# Patient Record
Sex: Male | Born: 1965 | Hispanic: Yes | Marital: Married | Smoking: Never smoker
Health system: Southern US, Community
[De-identification: ages and names within clinical notes are randomized; demographics above are authoritative.]

## PROBLEM LIST (undated history)

## (undated) DIAGNOSIS — I10 Essential (primary) hypertension: Secondary | ICD-10-CM

## (undated) DIAGNOSIS — E785 Hyperlipidemia, unspecified: Secondary | ICD-10-CM

## (undated) DIAGNOSIS — M1710 Unilateral primary osteoarthritis, unspecified knee: Secondary | ICD-10-CM

## (undated) DIAGNOSIS — S83207A Unspecified tear of unspecified meniscus, current injury, left knee, initial encounter: Secondary | ICD-10-CM

## (undated) DIAGNOSIS — S83242A Other tear of medial meniscus, current injury, left knee, initial encounter: Secondary | ICD-10-CM

## (undated) DIAGNOSIS — Z9189 Other specified personal risk factors, not elsewhere classified: Secondary | ICD-10-CM

## (undated) DIAGNOSIS — M179 Osteoarthritis of knee, unspecified: Secondary | ICD-10-CM

## (undated) HISTORY — DX: Other tear of medial meniscus, current injury, left knee, initial encounter: S83.242A

---

## 2010-07-07 ENCOUNTER — Ambulatory Visit: Payer: Self-pay | Admitting: Family Medicine

## 2010-10-19 ENCOUNTER — Ambulatory Visit: Payer: Self-pay | Admitting: Family Medicine

## 2014-02-20 ENCOUNTER — Emergency Department (HOSPITAL_COMMUNITY): Payer: Worker's Compensation

## 2014-02-20 ENCOUNTER — Encounter (HOSPITAL_COMMUNITY): Payer: Self-pay | Admitting: Emergency Medicine

## 2014-02-20 ENCOUNTER — Emergency Department (HOSPITAL_COMMUNITY)
Admission: EM | Admit: 2014-02-20 | Discharge: 2014-02-20 | Disposition: A | Discharge status: Home / Self Care | Payer: Worker's Compensation | Attending: Emergency Medicine | Admitting: Emergency Medicine

## 2014-02-20 DIAGNOSIS — I10 Essential (primary) hypertension: Secondary | ICD-10-CM | POA: Diagnosis not present

## 2014-02-20 DIAGNOSIS — S8990XA Unspecified injury of unspecified lower leg, initial encounter: Secondary | ICD-10-CM | POA: Diagnosis present

## 2014-02-20 DIAGNOSIS — R609 Edema, unspecified: Secondary | ICD-10-CM | POA: Diagnosis not present

## 2014-02-20 DIAGNOSIS — M79609 Pain in unspecified limb: Secondary | ICD-10-CM | POA: Diagnosis not present

## 2014-02-20 DIAGNOSIS — Y9289 Other specified places as the place of occurrence of the external cause: Secondary | ICD-10-CM | POA: Diagnosis not present

## 2014-02-20 DIAGNOSIS — Z79899 Other long term (current) drug therapy: Secondary | ICD-10-CM | POA: Insufficient documentation

## 2014-02-20 DIAGNOSIS — Y9389 Activity, other specified: Secondary | ICD-10-CM | POA: Insufficient documentation

## 2014-02-20 DIAGNOSIS — X500XXA Overexertion from strenuous movement or load, initial encounter: Secondary | ICD-10-CM | POA: Insufficient documentation

## 2014-02-20 DIAGNOSIS — E7800 Pure hypercholesterolemia, unspecified: Secondary | ICD-10-CM | POA: Insufficient documentation

## 2014-02-20 DIAGNOSIS — M79662 Pain in left lower leg: Secondary | ICD-10-CM

## 2014-02-20 HISTORY — DX: Essential (primary) hypertension: I10

## 2014-02-20 MED ORDER — NAPROXEN 500 MG PO TABS: 500.0000 mg | ORAL_TABLET | Freq: Two times a day (BID) | ORAL | Status: DC
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
## 2014-02-20 NOTE — ED Notes (Signed)
Ortho notified
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
## 2014-02-20 NOTE — Discharge Instructions (Signed)
It is possible that your symptoms may be secondary to a tendon rupture in your left leg/calf. Recommend you maintain your splint, elevate your leg, rest your leg, and ice it for symptom improvement. Followup with orthopedics as soon as possible for further evaluation of your injury. Until that time, do not bear weight in your left leg. Use crutches when walking. You may take naproxen for pain control. Return if symptoms worsen.  Tendon Injury Tendons are strong, cordlike structures that connect muscle to bone. Tendons are made up of woven fibers, like a rope. A tendon injury is a tear (rupture) of the tendon. The rupture may be partial (only a few of the fibers in your tendon rupture) or complete (your entire tendon ruptures). CAUSES  Tendon injuries can be caused by high-stress activities, such as sports. They also can be caused by a repetitive injury or by a single injury from an excessive, rapid force. SYMPTOMS  Symptoms of tendon injury include pain when you move the joint close to the tendon. Other symptoms are swelling, redness, and warmth. DIAGNOSIS  Tendon injuries often can be diagnosed by physical exam. However, sometimes an X-ray exam or advanced imaging, such as magnetic resonance imaging (MRI), is necessary to determine the extent of the injury. TREATMENT  Partial tendon ruptures often can be treated with immobilization. A splint, bandage, or removable brace usually is used to immobilize the injured tendon. Most injured tendons need to be immobilized for 1 2 months before they are completely healed. Complete tendon ruptures may require surgical reattachment. Document Released: 11/24/2004 Document Revised: 10/06/2011 Document Reviewed: 01/08/2012 [facility] Patient Information 2014 [employer], [place].

## 2014-02-21 NOTE — ED Provider Notes (Signed)
Medical screening examination/treatment/procedure(s) were performed by non-physician practitioner and as supervising physician I was immediately available for consultation/collaboration.   EKG Interpretation None        Dagmar HaitWilliam Darnisha Vernet, MD 02/21/14 [phone]

## 2015-01-16 ENCOUNTER — Other Ambulatory Visit: Payer: Self-pay | Admitting: Orthopedic Surgery

## 2015-01-28 ENCOUNTER — Encounter (HOSPITAL_BASED_OUTPATIENT_CLINIC_OR_DEPARTMENT_OTHER): Payer: Self-pay | Admitting: *Deleted

## 2015-01-28 NOTE — Progress Notes (Signed)
   01/28/15 1608  OBSTRUCTIVE SLEEP APNEA  Have you ever been diagnosed with sleep apnea through a sleep study? No  Do you snore loudly (loud enough to be heard through closed doors)?  0  Do you often feel tired, fatigued, or sleepy during the daytime? 0  Has anyone observed you stop breathing during your sleep? 0  Do you have, or are you being treated for high blood pressure? 1  BMI more than 35 kg/m2? 1  Age over 47 years old? 0  Neck circumference greater than 40 cm/16 inches? 1  Gender: 1  Obstructive Sleep Apnea Score 4   

## 2015-01-28 NOTE — Progress Notes (Signed)
NPO AFTER MN WITH EXCEPTION CLEAR LIQUIDS UNTIL 0900 (NO CREAM/ MILK PRODUCTS).  ARRIVE AT 1300. NEEDS ISTAT AND EKG. WILL TAKE TEKTURNA AND BYSTOLIC AM DOS W/ SIPS OF WATER.

## 2015-02-03 ENCOUNTER — Ambulatory Visit (HOSPITAL_BASED_OUTPATIENT_CLINIC_OR_DEPARTMENT_OTHER): Payer: 59 | Admitting: Anesthesiology

## 2015-02-03 ENCOUNTER — Other Ambulatory Visit: Payer: Self-pay

## 2015-02-03 ENCOUNTER — Encounter (HOSPITAL_BASED_OUTPATIENT_CLINIC_OR_DEPARTMENT_OTHER)
Admission: RE | Disposition: A | Discharge status: Home / Self Care | Payer: Self-pay | Source: Ambulatory Visit | Attending: Specialist

## 2015-02-03 ENCOUNTER — Ambulatory Visit (HOSPITAL_BASED_OUTPATIENT_CLINIC_OR_DEPARTMENT_OTHER)
Admission: RE | Admit: 2015-02-03 | Discharge: 2015-02-03 | Disposition: A | Discharge status: Home / Self Care | Payer: 59 | Source: Ambulatory Visit | Attending: Specialist | Admitting: Specialist

## 2015-02-03 ENCOUNTER — Encounter (HOSPITAL_BASED_OUTPATIENT_CLINIC_OR_DEPARTMENT_OTHER): Payer: Self-pay | Admitting: *Deleted

## 2015-02-03 DIAGNOSIS — Z79899 Other long term (current) drug therapy: Secondary | ICD-10-CM | POA: Insufficient documentation

## 2015-02-03 DIAGNOSIS — Z6841 Body Mass Index (BMI) 40.0 and over, adult: Secondary | ICD-10-CM | POA: Diagnosis not present

## 2015-02-03 DIAGNOSIS — M6752 Plica syndrome, left knee: Secondary | ICD-10-CM | POA: Diagnosis not present

## 2015-02-03 DIAGNOSIS — M2242 Chondromalacia patellae, left knee: Secondary | ICD-10-CM | POA: Diagnosis not present

## 2015-02-03 DIAGNOSIS — Y999 Unspecified external cause status: Secondary | ICD-10-CM | POA: Insufficient documentation

## 2015-02-03 DIAGNOSIS — I10 Essential (primary) hypertension: Secondary | ICD-10-CM | POA: Insufficient documentation

## 2015-02-03 DIAGNOSIS — M65862 Other synovitis and tenosynovitis, left lower leg: Secondary | ICD-10-CM | POA: Insufficient documentation

## 2015-02-03 DIAGNOSIS — M25562 Pain in left knee: Secondary | ICD-10-CM | POA: Diagnosis present

## 2015-02-03 DIAGNOSIS — X58XXXA Exposure to other specified factors, initial encounter: Secondary | ICD-10-CM | POA: Diagnosis not present

## 2015-02-03 DIAGNOSIS — Y929 Unspecified place or not applicable: Secondary | ICD-10-CM | POA: Diagnosis not present

## 2015-02-03 DIAGNOSIS — S83242A Other tear of medial meniscus, current injury, left knee, initial encounter: Secondary | ICD-10-CM

## 2015-02-03 DIAGNOSIS — Y939 Activity, unspecified: Secondary | ICD-10-CM | POA: Insufficient documentation

## 2015-02-03 DIAGNOSIS — M179 Osteoarthritis of knee, unspecified: Secondary | ICD-10-CM | POA: Insufficient documentation

## 2015-02-03 HISTORY — PX: KNEE ARTHROSCOPY WITH MEDIAL MENISECTOMY: SHX5651

## 2015-02-03 HISTORY — DX: Other specified personal risk factors, not elsewhere classified: Z91.89

## 2015-02-03 HISTORY — DX: Unspecified tear of unspecified meniscus, current injury, left knee, initial encounter: S83.207A

## 2015-02-03 HISTORY — DX: Osteoarthritis of knee, unspecified: M17.9

## 2015-02-03 HISTORY — DX: Unilateral primary osteoarthritis, unspecified knee: M17.10

## 2015-02-03 HISTORY — DX: Hyperlipidemia, unspecified: E78.5

## 2015-02-03 HISTORY — DX: Other tear of medial meniscus, current injury, left knee, initial encounter: S83.242A

## 2015-02-03 LAB — POCT I-STAT 4, (NA,K, GLUC, HGB,HCT)
GLUCOSE: 112 mg/dL — AB (ref 70–99)
HCT: 44 % (ref 39.0–52.0)
Hemoglobin: 15 g/dL (ref 13.0–17.0)
Potassium: 3.3 mmol/L — ABNORMAL LOW (ref 3.5–5.1)
SODIUM: 142 mmol/L (ref 135–145)

## 2015-02-03 SURGERY — ARTHROSCOPY, KNEE, WITH MEDIAL MENISCECTOMY
Anesthesia: General | Site: Knee | Laterality: Left

## 2015-02-03 MED ORDER — CEFAZOLIN SODIUM 1-5 GM-% IV SOLN
INTRAVENOUS | Status: AC
  Filled 2015-02-03: qty 50

## 2015-02-03 MED ORDER — HYDROCODONE-ACETAMINOPHEN 5-325 MG PO TABS: 1.0000 | ORAL_TABLET | Freq: Four times a day (QID) | ORAL | Status: DC | PRN

## 2015-02-03 MED ORDER — PROMETHAZINE HCL 25 MG/ML IJ SOLN
6.2500 mg | INTRAMUSCULAR | Status: DC | PRN
  Filled 2015-02-03: qty 1

## 2015-02-03 MED ORDER — FENTANYL CITRATE 0.05 MG/ML IJ SOLN
INTRAMUSCULAR | Status: DC | PRN
  Administered 2015-02-03 (×4): 50 ug via INTRAVENOUS

## 2015-02-03 MED ORDER — ONDANSETRON HCL 4 MG/2ML IJ SOLN
INTRAMUSCULAR | Status: DC | PRN
  Administered 2015-02-03: 4 mg via INTRAVENOUS

## 2015-02-03 MED ORDER — DEXAMETHASONE SODIUM PHOSPHATE 4 MG/ML IJ SOLN
INTRAMUSCULAR | Status: DC | PRN
  Administered 2015-02-03: 10 mg via INTRAVENOUS

## 2015-02-03 MED ORDER — BUPIVACAINE HCL 0.25 % IJ SOLN
INTRAMUSCULAR | Status: DC | PRN
  Administered 2015-02-03: 20 mL

## 2015-02-03 MED ORDER — FENTANYL CITRATE 0.05 MG/ML IJ SOLN
INTRAMUSCULAR | Status: AC
  Filled 2015-02-03: qty 6

## 2015-02-03 MED ORDER — CEPHALEXIN 500 MG PO CAPS
500.0000 mg | ORAL_CAPSULE | Freq: Three times a day (TID) | ORAL | Status: DC
Start: 2015-02-03 — End: 2015-10-28

## 2015-02-03 MED ORDER — CEFAZOLIN SODIUM-DEXTROSE 2-3 GM-% IV SOLR
INTRAVENOUS | Status: AC
  Filled 2015-02-03: qty 50

## 2015-02-03 MED ORDER — POVIDONE-IODINE 7.5 % EX SOLN
Freq: Once | CUTANEOUS | Status: DC
  Filled 2015-02-03: qty 118

## 2015-02-03 MED ORDER — PROPOFOL 10 MG/ML IV BOLUS
INTRAVENOUS | Status: DC | PRN
  Administered 2015-02-03: 200 mg via INTRAVENOUS

## 2015-02-03 MED ORDER — MORPHINE SULFATE 4 MG/ML IJ SOLN
INTRAMUSCULAR | Status: AC
  Filled 2015-02-03: qty 1

## 2015-02-03 MED ORDER — SODIUM CHLORIDE 0.9 % IR SOLN
Status: DC | PRN
  Administered 2015-02-03: 6000 mL

## 2015-02-03 MED ORDER — LIDOCAINE HCL (CARDIAC) 20 MG/ML IV SOLN
INTRAVENOUS | Status: DC | PRN
  Administered 2015-02-03: 100 mg via INTRAVENOUS

## 2015-02-03 MED ORDER — MORPHINE SULFATE 4 MG/ML IJ SOLN
INTRAMUSCULAR | Status: DC | PRN
Start: 2015-02-03 — End: 2015-02-03
  Administered 2015-02-03: 4 mg via INTRAMUSCULAR

## 2015-02-03 MED ORDER — ASPIRIN EC 325 MG PO TBEC
325.0000 mg | DELAYED_RELEASE_TABLET | Freq: Two times a day (BID) | ORAL | Status: DC
Start: 2015-02-03 — End: 2015-10-28

## 2015-02-03 MED ORDER — FENTANYL CITRATE 0.05 MG/ML IJ SOLN
25.0000 ug | INTRAMUSCULAR | Status: DC | PRN
  Filled 2015-02-03: qty 1

## 2015-02-03 MED ORDER — MIDAZOLAM HCL 5 MG/5ML IJ SOLN
INTRAMUSCULAR | Status: DC | PRN
  Administered 2015-02-03: 2 mg via INTRAVENOUS

## 2015-02-03 MED ORDER — EPHEDRINE SULFATE 50 MG/ML IJ SOLN
INTRAMUSCULAR | Status: DC | PRN
  Administered 2015-02-03: 10 mg via INTRAVENOUS

## 2015-02-03 MED ORDER — LACTATED RINGERS IV SOLN
INTRAVENOUS | Status: DC
  Administered 2015-02-03: 13:00:00 via INTRAVENOUS
  Filled 2015-02-03: qty 1000

## 2015-02-03 MED ORDER — MIDAZOLAM HCL 2 MG/2ML IJ SOLN
INTRAMUSCULAR | Status: AC
  Filled 2015-02-03: qty 2

## 2015-02-03 MED ORDER — DEXTROSE 5 % IV SOLN
3.0000 g | INTRAVENOUS | Status: AC
  Administered 2015-02-03: 3 g via INTRAVENOUS
  Filled 2015-02-03: qty 3000

## 2015-02-03 SURGICAL SUPPLY — 52 items
BANDAGE ESMARK 6X9 LF (GAUZE/BANDAGES/DRESSINGS) ×1 IMPLANT
BLADE 4.2CUDA (BLADE) IMPLANT
BLADE CUDA 4.2 (BLADE) IMPLANT
BLADE CUDA GRT WHITE 3.5 (BLADE) ×2 IMPLANT
BLADE CUDA SHAVER 3.5 (BLADE) IMPLANT
BNDG CMPR 9X6 STRL LF SNTH (GAUZE/BANDAGES/DRESSINGS) ×1
BNDG ESMARK 6X9 LF (GAUZE/BANDAGES/DRESSINGS) ×2
BNDG GAUZE ELAST 4 BULKY (GAUZE/BANDAGES/DRESSINGS) ×4 IMPLANT
CANISTER SUCT LVC 12 LTR MEDI- (MISCELLANEOUS) ×2 IMPLANT
CANISTER SUCTION 1200CC (MISCELLANEOUS) ×2 IMPLANT
CLOTH BEACON ORANGE TIMEOUT ST (SAFETY) ×2 IMPLANT
CUFF TOURNIQUET SINGLE 34IN LL (TOURNIQUET CUFF) ×2 IMPLANT
DRAPE ARTHROSCOPY W/POUCH 114 (DRAPES) ×2 IMPLANT
DRAPE INCISE 23X17 IOBAN STRL (DRAPES) ×1
DRAPE INCISE IOBAN 23X17 STRL (DRAPES) ×1 IMPLANT
DRAPE INCISE IOBAN 66X45 STRL (DRAPES) ×2 IMPLANT
DURAPREP 26ML APPLICATOR (WOUND CARE) ×2 IMPLANT
ELECT MENISCUS 165MM 90D (ELECTRODE) IMPLANT
ELECT REM PT RETURN 9FT ADLT (ELECTROSURGICAL)
ELECTRODE REM PT RTRN 9FT ADLT (ELECTROSURGICAL) IMPLANT
GAUZE XEROFORM 1X8 LF (GAUZE/BANDAGES/DRESSINGS) ×2 IMPLANT
GLOVE BIO SURGEON STRL SZ7.5 (GLOVE) ×2 IMPLANT
GLOVE BIO SURGEON STRL SZ8 (GLOVE) ×4 IMPLANT
GLOVE INDICATOR 8.0 STRL GRN (GLOVE) ×4 IMPLANT
GOWN PREVENTION PLUS LG XLONG (DISPOSABLE) ×2 IMPLANT
GOWN STRL REIN XL XLG (GOWN DISPOSABLE) ×2 IMPLANT
GOWN STRL REUS W/ TWL XL LVL3 (GOWN DISPOSABLE) ×2 IMPLANT
GOWN STRL REUS W/TWL XL LVL3 (GOWN DISPOSABLE) ×4 IMPLANT
IMMOBILIZER KNEE 22 UNIV (SOFTGOODS) IMPLANT
IMMOBILIZER KNEE 24 THIGH 36 (MISCELLANEOUS) IMPLANT
IMMOBILIZER KNEE 24 UNIV (MISCELLANEOUS)
KNEE WRAP E Z 3 GEL PACK (MISCELLANEOUS) ×2 IMPLANT
MINI VAC (SURGICAL WAND) ×2 IMPLANT
NEEDLE HYPO 22GX1.5 SAFETY (NEEDLE) ×2 IMPLANT
PACK ARTHROSCOPY DSU (CUSTOM PROCEDURE TRAY) ×2 IMPLANT
PACK BASIN DAY SURGERY FS (CUSTOM PROCEDURE TRAY) ×2 IMPLANT
PAD ABD 8X10 STRL (GAUZE/BANDAGES/DRESSINGS) ×4 IMPLANT
PADDING CAST ABS 4INX4YD NS (CAST SUPPLIES) ×1
PADDING CAST ABS COTTON 4X4 ST (CAST SUPPLIES) ×1 IMPLANT
PADDING CAST COTTON 6X4 STRL (CAST SUPPLIES) ×2 IMPLANT
PENCIL BUTTON HOLSTER BLD 10FT (ELECTRODE) IMPLANT
SET ARTHROSCOPY TUBING (MISCELLANEOUS) ×2
SET ARTHROSCOPY TUBING LN (MISCELLANEOUS) ×1 IMPLANT
SPONGE GAUZE 4X4 12PLY (GAUZE/BANDAGES/DRESSINGS) ×2 IMPLANT
SPONGE GAUZE 4X4 12PLY STER LF (GAUZE/BANDAGES/DRESSINGS) ×2 IMPLANT
SUT ETHILON 4 0 PS 2 18 (SUTURE) ×2 IMPLANT
SYR CONTROL 10ML LL (SYRINGE) ×2 IMPLANT
TOWEL OR 17X24 6PK STRL BLUE (TOWEL DISPOSABLE) ×2 IMPLANT
TUBE CONNECTING 12X1/4 (SUCTIONS) ×2 IMPLANT
WAND 30 DEG SABER W/CORD (SURGICAL WAND) IMPLANT
WAND 90 DEG TURBOVAC W/CORD (SURGICAL WAND) IMPLANT
WATER STERILE IRR 500ML POUR (IV SOLUTION) ×2 IMPLANT

## 2015-02-03 NOTE — H&P (Signed)
Terry Holden is an 49 y.o. male.   Chief Complaint: Left pain HPI: Patient presents with joint discomfort that had been persistent for several months now. Despite conservative treatments, his discomfort has not improved. Imaging was obtained. Other conservative and surgical treatments were discussed in detail. Patient wishes to proceed with surgery as consented. Denies SOB, CP, or calf pain. No Fever, chills, or nausea/ vomiting.   Past Medical History  Diagnosis Date  . Hypertension   . Hyperlipidemia   . Acute meniscal tear of left knee   . OA (osteoarthritis) of knee     left  . At risk for sleep apnea     STOP-BANG= 4      SENT TO PCP 01-28-2015    History reviewed. No pertinent past surgical history.  History reviewed. No pertinent family history. Social History:  reports that he has never smoked. He has never used smokeless tobacco. He reports that he drinks about 1.2 oz of alcohol per week. He reports that he does not use illicit drugs.  Allergies:  Allergies  Allergen Reactions  . Bee Venom Swelling  . Amoxicillin Rash    Medications Prior to Admission  Medication Sig Dispense Refill  . Azilsartan-Chlorthalidone 40-25 MG TABS Take 1 tablet by mouth every morning.     . nebivolol (BYSTOLIC) 10 MG tablet Take 10 mg by mouth every morning.     . TEKTURNA 300 MG tablet Take 300 mg by mouth every morning.       Results for orders placed or performed during the hospital encounter of 02/03/15 (from the past 48 hour(s))  I-STAT 4, (NA,K, GLUC, HGB,HCT)     Status: Abnormal   Collection Time: 02/03/15 12:39 PM  Result Value Ref Range   Sodium 142 135 - 145 mmol/L   Potassium 3.3 (L) 3.5 - 5.1 mmol/L   Glucose, Bld 112 (H) 70 - 99 mg/dL   HCT 40.944.0 81.139.0 - 91.452.0 %   Hemoglobin 15.0 13.0 - 17.0 g/dL   No results found.  Review of Systems  Constitutional: Negative.   HENT: Negative.   Eyes: Negative.   Respiratory: Negative.   Cardiovascular: Negative.   Gastrointestinal:  Negative.   Genitourinary: Negative.   Musculoskeletal: Positive for joint pain.  Skin: Negative.   Neurological: Negative.   Endo/Heme/Allergies: Negative.   Psychiatric/Behavioral: Negative.     Blood pressure 183/87, pulse 76, temperature 98.5 F (36.9 C), temperature source Oral, resp. rate 18, height 5\' 11"  (1.803 m), weight 131.543 kg (290 lb), SpO2 100 %. Physical Exam  Constitutional: He is oriented to person, place, and time. He appears well-developed.  HENT:  Head: Normocephalic.  Eyes: EOM are normal.  Neck: Normal range of motion.  Cardiovascular: Normal rate, normal heart sounds and intact distal pulses.   Respiratory: Effort normal.  GI: Bowel sounds are normal.  Genitourinary:  Deferred  Musculoskeletal:  LLE N/V intact. Calf soft and non tender. ROM normal with pain.   Neurological: He is alert and oriented to person, place, and time.  Skin: Skin is warm and dry.  Psychiatric: His behavior is normal.     Assessment/Plan Left knee torn mensicus and OA: Left Knee Scope with PMM and Chondroplasty D/c home today with family monitoring and care Follow instructions F/u in office next week   STILWELL, BRYSON L 02/03/2015, 3:04 PM

## 2015-02-03 NOTE — Anesthesia Preprocedure Evaluation (Signed)
Anesthesia Evaluation  Patient identified by MRN, date of birth, ID band Patient awake    Reviewed: Allergy & Precautions, NPO status , Patient's Chart, lab work & pertinent test results  Airway Mallampati: II  TM Distance: >3 FB Neck ROM: Full    Dental no notable dental hx.    Pulmonary neg pulmonary ROS,  breath sounds clear to auscultation  Pulmonary exam normal       Cardiovascular hypertension, Pt. on medications and Pt. on home beta blockers Rhythm:Regular Rate:Normal     Neuro/Psych negative neurological ROS  negative psych ROS   GI/Hepatic negative GI ROS, Neg liver ROS,   Endo/Other  Morbid obesity  Renal/GU negative Renal ROS  negative genitourinary   Musculoskeletal  (+) Arthritis -,   Abdominal (+) + obese,   Peds negative pediatric ROS (+)  Hematology negative hematology ROS (+)   Anesthesia Other Findings   Reproductive/Obstetrics negative OB ROS                             Anesthesia Physical Anesthesia Plan  ASA: III  Anesthesia Plan: General   Post-op Pain Management:    Induction: Intravenous  Airway Management Planned: LMA  Additional Equipment:   Intra-op Plan:   Post-operative Plan: Extubation in OR  Informed Consent: I have reviewed the patients History and Physical, chart, labs and discussed the procedure including the risks, benefits and alternatives for the proposed anesthesia with the patient or authorized representative who has indicated his/her understanding and acceptance.   Dental advisory given  Plan Discussed with: CRNA  Anesthesia Plan Comments:         Anesthesia Quick Evaluation

## 2015-02-03 NOTE — Discharge Instructions (Signed)

## 2015-02-03 NOTE — Op Note (Signed)
[phone]#[id number]

## 2015-02-03 NOTE — Interval H&P Note (Signed)
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 After consideration of risks, benefits and other options for treatment, the patient has consented to  Procedure(s): LEFT KNEE ARTHROSCOPY WITH PARTIAL MEDIAL MENISECTOMY AND DEBRIDEMENT/POSSIBLE CHONDROPLASTY (Left) as a surgical intervention .  The patient's history has been reviewed, patient examined, no change in status, stable for surgery.  I have reviewed the patient's chart and labs.  Questions were answered to the patient's satisfaction.     Jamai Dolce ANDREW

## 2015-02-03 NOTE — H&P (View-Only) (Signed)
   01/28/15 1608  OBSTRUCTIVE SLEEP APNEA  Have you ever been diagnosed with sleep apnea through a sleep study? No  Do you snore loudly (loud enough to be heard through closed doors)?  0  Do you often feel tired, fatigued, or sleepy during the daytime? 0  Has anyone observed you stop breathing during your sleep? 0  Do you have, or are you being treated for high blood pressure? 1  BMI more than 35 kg/m2? 1  Age over 47 years old? 0  Neck circumference greater than 40 cm/16 inches? 1  Gender: 1  Obstructive Sleep Apnea Score 4

## 2015-02-03 NOTE — Anesthesia Procedure Notes (Signed)
Procedure Name: LMA Insertion Date/Time: 02/03/2015 3:35 PM Performed by: Terry Holden, Terry Holden Pre-anesthesia Checklist: Patient identified, Emergency Drugs available, Suction available and Patient being monitored Patient Re-evaluated:Patient Re-evaluated prior to inductionOxygen Delivery Method: Circle System Utilized Preoxygenation: Pre-oxygenation with 100% oxygen Intubation Type: IV induction Ventilation: Mask ventilation without difficulty LMA: LMA inserted LMA Size: 4.0 Number of attempts: 1 Airway Equipment and Method: Bite block Placement Confirmation: positive ETCO2 Tube secured with: Tape Dental Injury: Teeth and Oropharynx as per pre-operative assessment

## 2015-02-04 ENCOUNTER — Encounter (HOSPITAL_BASED_OUTPATIENT_CLINIC_OR_DEPARTMENT_OTHER): Payer: Self-pay | Admitting: Specialist

## 2015-02-04 NOTE — Transfer of Care (Signed)
Immediate Anesthesia Transfer of Care Note  Patient: Terry Holden  Procedure(s) Performed: Procedure(s): LEFT KNEE ARTHROSCOPY WITH PARTIAL MEDIAL MENISECTOMY AND DEBRIDEMENT/POSSIBLE CHONDROPLASTY (Left)  Patient Location: PACU  Anesthesia Type:General  Level of Consciousness: awake, alert , oriented and patient cooperative  Airway & Oxygen Therapy: Patient Spontanous Breathing and Patient connected to face mask oxygen  Post-op Assessment: Report given to RN and Post -op Vital signs reviewed and stable  Post vital signs: Reviewed and stable  Last Vitals:  Filed Vitals:   02/03/15 1737  BP: 133/75  Pulse: 71  Temp: 36.7 C  Resp: 16    Complications: No apparent anesthesia complications

## 2015-02-04 NOTE — Anesthesia Postprocedure Evaluation (Signed)
  Anesthesia Post-op Note  Patient: Terry Holden  Procedure(s) Performed: Procedure(s) (LRB): LEFT KNEE ARTHROSCOPY WITH PARTIAL MEDIAL MENISECTOMY AND DEBRIDEMENT/POSSIBLE CHONDROPLASTY (Left)  Patient Location: PACU  Anesthesia Type: general  Level of Consciousness: awake and alert   Airway and Oxygen Therapy: Patient Spontanous Breathing  Post-op Pain: mild  Post-op Assessment: Post-op Vital signs reviewed, Patient's Cardiovascular Status Stable, Respiratory Function Stable, Patent Airway and No signs of Nausea or vomiting  Last Vitals:  Filed Vitals:   02/03/15 1737  BP: 133/75  Pulse: 71  Temp: 36.7 C  Resp: 16    Post-op Vital Signs: stable   Complications: No apparent anesthesia complications

## 2015-02-04 NOTE — Op Note (Signed)
NAMLeone Payor:  Terry Holden, Terry Holden                 ACCOUNT NO.:  [id number]  MEDICAL RECORD NO.:  [id number]  LOCATION:                                 FACILITY:  PHYSICIAN:  Erasmo Leventhalobert Andrew [name], M.D.DATE OF BIRTH:  [birth year]  DATE OF PROCEDURE:  02/03/2015 DATE OF DISCHARGE:  02/03/2015                              OPERATIVE REPORT   PREOPERATIVE DIAGNOSES:  Left knee torn medial meniscus, osteoarthritis.  POSTOPERATIVE DIAGNOSES: 1. Left knee small tear posterior horn medial meniscus. 2. Osteoarthritis, grade 3 extensive femoral trochlea, grade 3     extensive patella, grade 3 lateral, and grade 3 extensive medial     compartment.  PROCEDURES: 1. Left knee arthroscopic partial medial meniscectomy. 2. Chondroplasty of patella, femoral trochlea, and medial compartment.  SURGEON:  Erasmo Leventhalobert Andrew [name], M.D.  ASSISTANT:  [name], PA-C.  ANESTHESIA:  General with intraoperative knee block.  ESTIMATED BLOOD LOSS:  Minimal.  DRAINS:  None.  COMPLICATION:  None.  TOURNIQUET TIME:  23 minutes.  DISPOSITION:  To PACU stable.  OPERATIVE DETAILS:  The patient was counseled in the holding area. Correct site was identified and marked appropriately.  IV antibiotics were given in the OR.  Placed in supine position under general anesthesia.  TED hose had been applied to the uninvolved leg.  Left thigh was placed into a thigh holder and prepped with DuraPrep and draped in a sterile fashion.  Time-out was done and confirmed the left side.  Exsanguinated with an Esmarch.  Tourniquet was inflated to 300 mmHg.  Arthroscopic portals were established.  Proximal lateral, inferolateral, inferomedial, diagnostic arthroscopy revealed intact suprapatellar pouch with a plica formation and synovitis medially and it was resected with motorized shaver.  There was grade 3 extensive chondromalacia of patella, chondroplasty for mechanical shaver, back to stable base, femoral trochlea, normal tracking,  grade 3 chondromalacia, chondroplasty, mechanical shaver, back to stable base.  ACL and PCL were intact.  Lateral side was inspected, lateral meniscus was intact, lateral femoral condyle unremarkable.  Mild chondromalacia, lateral tibial plateau did not require chondroplasty.  Medial compartment inspected.  Small radial tear, posterior-medial meniscus, debrided back to a smooth down the mini VAC system and extensive grade 2 chondromalacia medial compartment and large stable chondral flaps with a mechanical shaver performing chondroplasty back to a nice smooth edge. Knee was then sequentially inspected.  There were no other abnormalities noted, irrigated and arthroscopic equipment was removed.  Portals were closed with 4-0 nylon suture.  At the end of the case, 10 mL 0.25% Sensorcaine was placed to the skin and 20 mL of 0.25% Sensorcaine was placed in the knee joint with 4 mg of morphine sulfate.  Sterile dressing was applied.  Tourniquet deflated.  TED hose was applied.  Ice pack was applied.  Awakened and taken from the operating room to the PACU in stable condition.  He will be stabilized in PACU and discharged home.          ______________________________ Erasmo Leventhalobert Andrew [name], M.D.     RAC/MEDQ  D:  02/03/2015  T:  02/04/2015  Job:  [id number]

## 2015-10-28 ENCOUNTER — Emergency Department (HOSPITAL_BASED_OUTPATIENT_CLINIC_OR_DEPARTMENT_OTHER)
Admission: EM | Admit: 2015-10-28 | Discharge: 2015-10-28 | Disposition: A | Discharge status: Home / Self Care | Payer: Commercial Managed Care - HMO | Attending: Emergency Medicine | Admitting: Emergency Medicine

## 2015-10-28 ENCOUNTER — Encounter (HOSPITAL_BASED_OUTPATIENT_CLINIC_OR_DEPARTMENT_OTHER): Payer: Self-pay | Admitting: Emergency Medicine

## 2015-10-28 ENCOUNTER — Other Ambulatory Visit: Payer: Self-pay | Admitting: Chiropractic Medicine

## 2015-10-28 ENCOUNTER — Ambulatory Visit
Admission: RE | Admit: 2015-10-28 | Discharge: 2015-10-28 | Disposition: A | Discharge status: Home / Self Care | Payer: Commercial Managed Care - HMO | Source: Ambulatory Visit | Attending: Chiropractic Medicine | Admitting: Chiropractic Medicine

## 2015-10-28 DIAGNOSIS — M545 Low back pain: Secondary | ICD-10-CM | POA: Diagnosis present

## 2015-10-28 DIAGNOSIS — Z8639 Personal history of other endocrine, nutritional and metabolic disease: Secondary | ICD-10-CM | POA: Insufficient documentation

## 2015-10-28 DIAGNOSIS — R52 Pain, unspecified: Secondary | ICD-10-CM

## 2015-10-28 DIAGNOSIS — M5388 Other specified dorsopathies, sacral and sacrococcygeal region: Secondary | ICD-10-CM | POA: Insufficient documentation

## 2015-10-28 DIAGNOSIS — M533 Sacrococcygeal disorders, not elsewhere classified: Secondary | ICD-10-CM

## 2015-10-28 DIAGNOSIS — Z880 Allergy status to penicillin: Secondary | ICD-10-CM | POA: Insufficient documentation

## 2015-10-28 DIAGNOSIS — I10 Essential (primary) hypertension: Secondary | ICD-10-CM | POA: Insufficient documentation

## 2015-10-28 DIAGNOSIS — Z87828 Personal history of other (healed) physical injury and trauma: Secondary | ICD-10-CM | POA: Diagnosis not present

## 2015-10-28 DIAGNOSIS — M179 Osteoarthritis of knee, unspecified: Secondary | ICD-10-CM | POA: Insufficient documentation

## 2015-10-28 MED ORDER — METHYLPREDNISOLONE SODIUM SUCC 125 MG IJ SOLR
80.0000 mg | Freq: Once | INTRAMUSCULAR | Status: AC
  Administered 2015-10-28: 80 mg via INTRAMUSCULAR
  Filled 2015-10-28: qty 2

## 2015-10-28 MED ORDER — CYCLOBENZAPRINE HCL 10 MG PO TABS: 10.0000 mg | ORAL_TABLET | Freq: Two times a day (BID) | ORAL | Status: DC | PRN

## 2015-10-28 MED ORDER — HYDROMORPHONE HCL 1 MG/ML IJ SOLN
1.0000 mg | Freq: Once | INTRAMUSCULAR | Status: AC
  Administered 2015-10-28: 1 mg via INTRAMUSCULAR
  Filled 2015-10-28: qty 1

## 2015-10-28 MED ORDER — OXYCODONE-ACETAMINOPHEN 5-325 MG PO TABS: 1.0000 | ORAL_TABLET | Freq: Four times a day (QID) | ORAL | Status: DC | PRN

## 2015-10-28 NOTE — ED Notes (Signed)
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

## 2015-10-28 NOTE — ED Notes (Signed)
MD at bedside. 

## 2015-10-28 NOTE — ED Provider Notes (Signed)
CSN: [id number]     Arrival date & time 10/28/15  1359 History   First MD Initiated Contact with Patient 10/28/15 1555     Chief Complaint  Patient presents with  . Back Pain     Patient is a 49 y.o. male presenting with back pain. The history is provided by the patient. No language interpreter was used.  Back Pain  Terry Piqueeter Holden is a 49 y.o. male who presents to the Emergency Department complaining of low back pain. He has a history of sacral pain that started when he was in the [employer]. He has not had any problems with pain in that area until about a week and a half ago. Started after doing some exercises for work. He is not sure if it is related to the exercise or not. He reports pain in the low sacral/coccygeal region. Pain is described as a soreness and tightness that is worse with sitting, twisting, bending. The pain radiates a small amount to the right buttock. He has no pain radiating down his legs. No fevers, abdominal pain, dysuria, incontinence, constipation, pain with defecation. He was seen by his chiropractor today who performed x-rays and recommended referral to the emergency Department or urgent care for pain control. Symptoms are moderate, constant, worsening.  Past Medical History  Diagnosis Date  . Hypertension   . Hyperlipidemia   . Acute meniscal tear of left knee   . OA (osteoarthritis) of knee     left  . At risk for sleep apnea     STOP-BANG= 4      SENT TO PCP 01-28-2015   Past Surgical History  Procedure Laterality Date  . Knee arthroscopy with medial menisectomy Left 02/03/2015    Procedure: LEFT KNEE ARTHROSCOPY WITH PARTIAL MEDIAL MENISECTOMY AND DEBRIDEMENT/POSSIBLE CHONDROPLASTY;  Surgeon: Eugenia Mcalpineobert Collins, MD;  [facility];  Service: Orthopedics;  Laterality: Left;   History reviewed. No pertinent family history. Social History  Substance Use Topics  . Smoking status: Never Smoker   . Smokeless tobacco: Never Used  . Alcohol Use:  1.2 oz/week    2 Glasses of wine per week    Review of Systems  Musculoskeletal: Positive for back pain.  All other systems reviewed and are negative.     Allergies  Bee venom and Amoxicillin  Home Medications   Prior to Admission medications   Medication Sig Start Date End Date Taking? Authorizing Provider  Azilsartan-Chlorthalidone 40-25 MG TABS Take 1 tablet by mouth every morning.    Yes Historical Provider, MD  ibuprofen (ADVIL,MOTRIN) 200 MG tablet Take 200 mg by mouth every 6 (six) hours as needed.   Yes Historical Provider, MD  nebivolol (BYSTOLIC) 10 MG tablet Take 10 mg by mouth every morning.    Yes Historical Provider, MD  TEKTURNA 300 MG tablet Take 300 mg by mouth every morning.  12/31/13  Yes Historical Provider, MD   BP 142/97 mmHg  Pulse 78  Temp(Src) 98 F (36.7 C) (Oral)  Resp 20  Ht 5\' 11"  (1.803 m)  Wt 300 lb (136.079 kg)  BMI 41.86 kg/m2  SpO2 100% Physical Exam  Constitutional: He is oriented to person, place, and time. He appears well-developed and well-nourished.  HENT:  Head: Normocephalic and atraumatic.  Cardiovascular: Normal rate and regular rhythm.   Pulmonary/Chest: Effort normal. No respiratory distress.  Abdominal: Soft. There is no tenderness. There is no rebound and no guarding.  Musculoskeletal: Normal range of motion.  Area of pain is  located in the lower sacral region at the gluteal cleft. There is no appreciable tenderness on examination. No overlying rashes, erythema, edema.  Neurological: He is alert and oriented to person, place, and time.  5 out of 5 strength in bilateral lower extremities. Normal gait.  Skin: Skin is warm.  Psychiatric: He has a normal mood and affect. His behavior is normal.  Nursing note and vitals reviewed.   ED Course  Procedures (including critical care time) Labs Review Labs Reviewed - No data to display  Imaging Review Dg Lumbar Spine Complete  10/28/2015  CLINICAL DATA:  Lower lumbar pain  midline sharp with minimal movement. No injury. EXAM: LUMBAR SPINE - COMPLETE 4+ VIEW COMPARISON:  None. FINDINGS: Vertebral body alignment, heights and disc space heights are normal. There is minimal spondylosis present. Mild facet arthropathy over the lower lumbar spine. No evidence of compression fracture or subluxation. IMPRESSION: No acute findings. Minimal spondylosis. Electronically Signed   By: Elberta Fortis M.D.   On: 10/28/2015 12:47   Dg Sacrum/coccyx  10/28/2015  CLINICAL DATA:  Low back pain without known injury. EXAM: SACRUM AND COCCYX - 2+ VIEW COMPARISON:  None. FINDINGS: There is no evidence of fracture or other focal bone lesions. Sacroiliac joints appear normal. IMPRESSION: Normal sacrum and coccyx. Electronically Signed   By: Lupita Raider, M.D.   On: 10/28/2015 12:50   I have personally reviewed and evaluated these images and lab results as part of my medical decision-making.   EKG Interpretation None      MDM   Final diagnoses:  Sacral back pain    Patient here for hydration of acute low back/sacral pain. He is neurovascularly intact on examination. No systemic symptoms or concerns for acute infection. Discussed with patient having care for musculoskeletal pain, he has orthopedics referral and follow-up. Discussed return precautions.    Tilden Fossa, MD 10/28/15 [phone]

## 2015-10-28 NOTE — Discharge Instructions (Signed)

## 2016-11-01 DIAGNOSIS — M1712 Unilateral primary osteoarthritis, left knee: Secondary | ICD-10-CM | POA: Diagnosis not present

## 2016-11-01 DIAGNOSIS — M94262 Chondromalacia, left knee: Secondary | ICD-10-CM | POA: Diagnosis not present

## 2016-11-01 DIAGNOSIS — S83232D Complex tear of medial meniscus, current injury, left knee, subsequent encounter: Secondary | ICD-10-CM | POA: Diagnosis not present

## 2016-12-07 DIAGNOSIS — M1712 Unilateral primary osteoarthritis, left knee: Secondary | ICD-10-CM | POA: Diagnosis not present

## 2016-12-14 DIAGNOSIS — M1712 Unilateral primary osteoarthritis, left knee: Secondary | ICD-10-CM | POA: Diagnosis not present

## 2016-12-21 DIAGNOSIS — M1712 Unilateral primary osteoarthritis, left knee: Secondary | ICD-10-CM | POA: Diagnosis not present

## 2017-05-08 DIAGNOSIS — L255 Unspecified contact dermatitis due to plants, except food: Secondary | ICD-10-CM | POA: Diagnosis not present

## 2017-09-20 DIAGNOSIS — E782 Mixed hyperlipidemia: Secondary | ICD-10-CM | POA: Diagnosis not present

## 2017-09-20 DIAGNOSIS — I10 Essential (primary) hypertension: Secondary | ICD-10-CM | POA: Diagnosis not present

## 2018-02-12 DIAGNOSIS — R399 Unspecified symptoms and signs involving the genitourinary system: Secondary | ICD-10-CM | POA: Diagnosis not present

## 2018-02-12 DIAGNOSIS — E782 Mixed hyperlipidemia: Secondary | ICD-10-CM | POA: Diagnosis not present

## 2018-02-12 DIAGNOSIS — N401 Enlarged prostate with lower urinary tract symptoms: Secondary | ICD-10-CM | POA: Diagnosis not present

## 2018-02-12 DIAGNOSIS — R3911 Hesitancy of micturition: Secondary | ICD-10-CM | POA: Diagnosis not present

## 2018-03-22 DIAGNOSIS — I10 Essential (primary) hypertension: Secondary | ICD-10-CM | POA: Diagnosis not present

## 2018-03-22 DIAGNOSIS — E782 Mixed hyperlipidemia: Secondary | ICD-10-CM | POA: Diagnosis not present

## 2018-03-22 DIAGNOSIS — Z8249 Family history of ischemic heart disease and other diseases of the circulatory system: Secondary | ICD-10-CM | POA: Diagnosis not present

## 2018-03-24 ENCOUNTER — Other Ambulatory Visit: Payer: Self-pay | Admitting: Family Medicine

## 2018-03-24 DIAGNOSIS — E782 Mixed hyperlipidemia: Secondary | ICD-10-CM

## 2018-04-09 ENCOUNTER — Ambulatory Visit
Admission: RE | Admit: 2018-04-09 | Discharge: 2018-04-09 | Disposition: A | Discharge status: Home / Self Care | Payer: No Typology Code available for payment source | Source: Ambulatory Visit | Attending: Family Medicine | Admitting: Family Medicine

## 2018-04-09 DIAGNOSIS — E782 Mixed hyperlipidemia: Secondary | ICD-10-CM

## 2018-06-29 ENCOUNTER — Telehealth: Payer: Self-pay

## 2018-06-29 NOTE — Telephone Encounter (Signed)
Notes on file. Referral sent to scheduling.

## 2018-11-08 DIAGNOSIS — I10 Essential (primary) hypertension: Secondary | ICD-10-CM | POA: Diagnosis not present

## 2018-11-08 DIAGNOSIS — E782 Mixed hyperlipidemia: Secondary | ICD-10-CM | POA: Diagnosis not present

## 2018-11-08 DIAGNOSIS — Z0000 Encounter for general adult medical examination without abnormal findings: Secondary | ICD-10-CM | POA: Diagnosis not present

## 2019-01-08 IMAGING — CT CT HEART SCORING
3 series · 14 of 20 positions shown, 16 images · non-contrast
Comparison: None.

CLINICAL DATA: Mixed hyperlipidemia.

EXAM:
CT HEART FOR CALCIUM SCORING
TECHNIQUE: CT heart was performed on a 64 channel system using prospective ECG
gating.
A non-contrast exam for calcium scoring was performed.
Note that this exam targets the heart and the chest was not imaged
in its entirety.

[Series 2: calcium scoring 2.00 qr36 bestdiast 70% · axial · 0.40mm/px · z∈[+1494,+1590]mm · 4 of 80 slices shown]
[im 16/80  vessel]
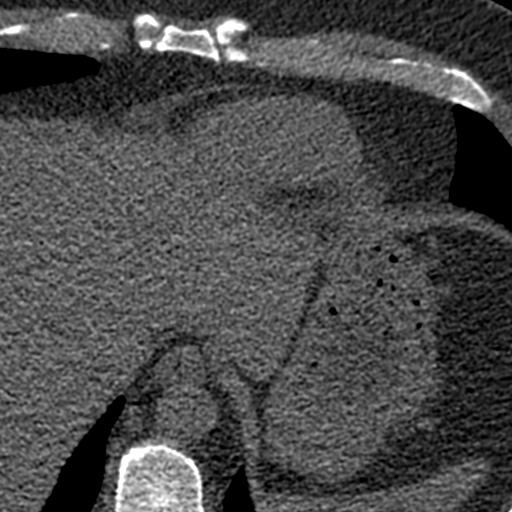
[im 32/80  vessel]
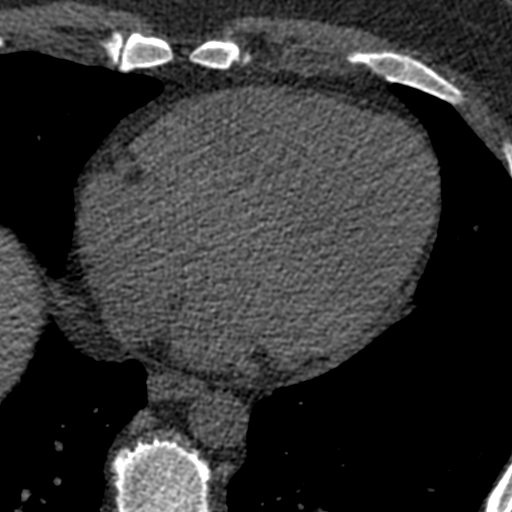
[im 48/80  vessel]
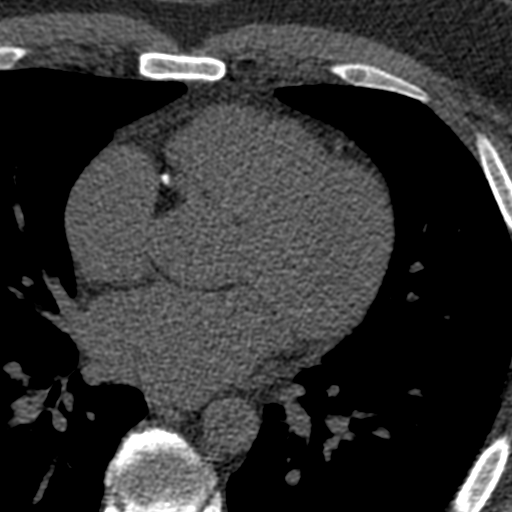
[im 64/80  vessel]
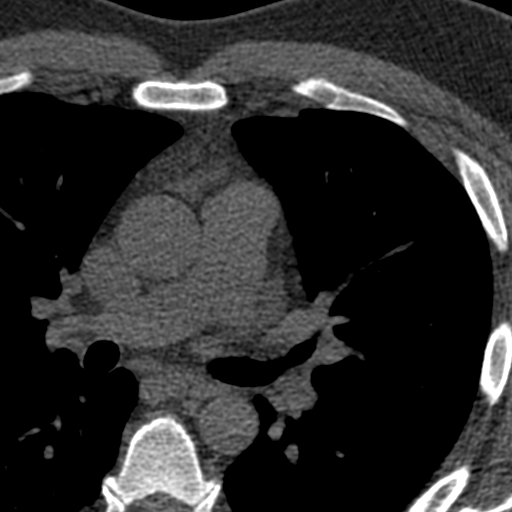

[Series 3: calcium scoring 2.00 br40 bestdiast 70% ax fov · axial · 0.67mm/px · z∈[+1490,+1594]mm · 5 of 80 slices shown, 7 images]
[im 14/80  vessel]
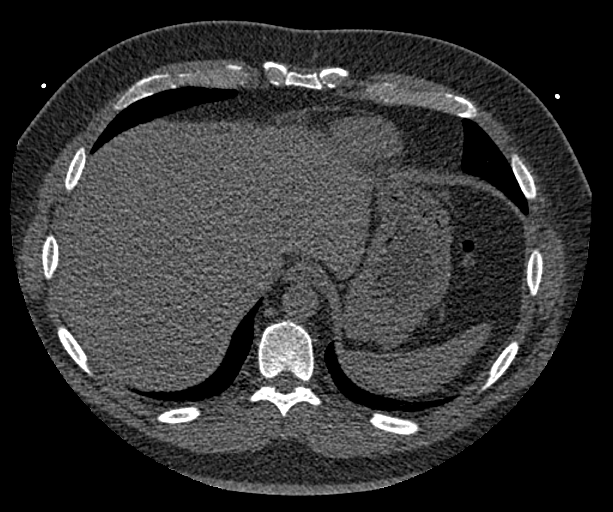
[im 14/80  lung]
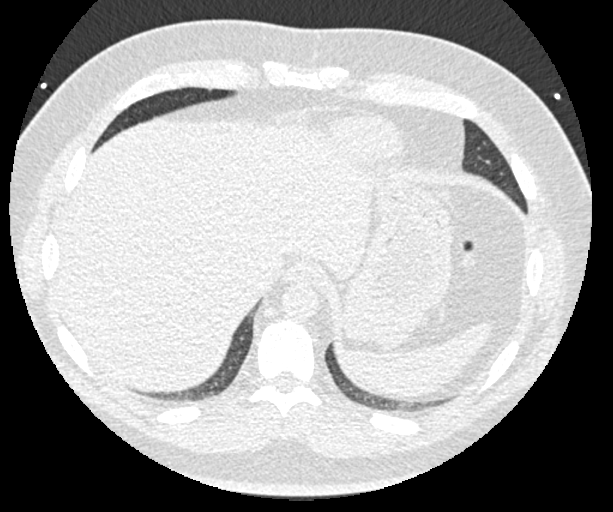
[im 27/80  vessel]
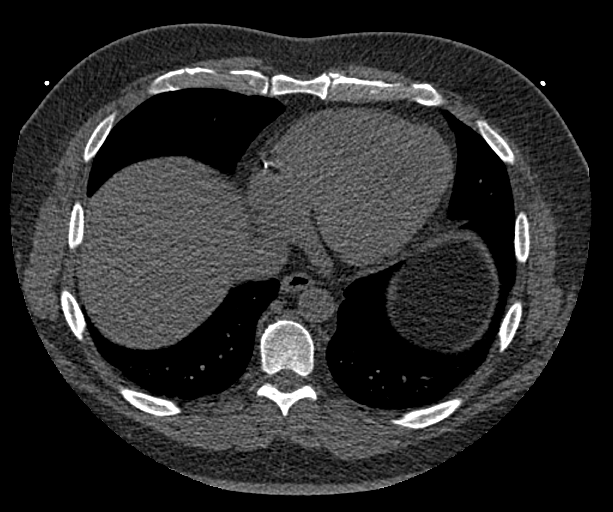
[im 40/80  vessel]
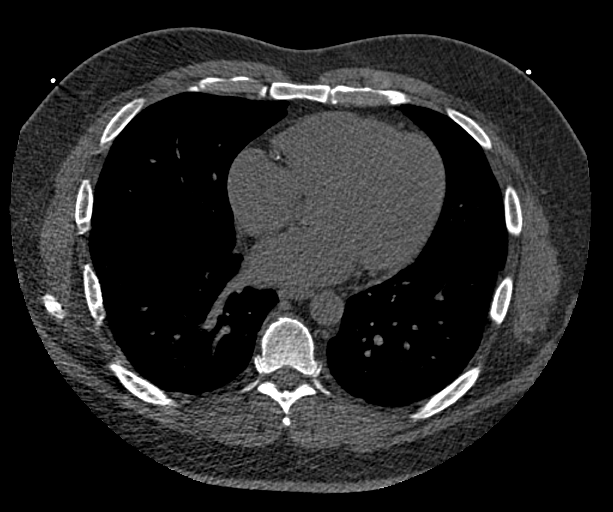
[im 53/80  vessel]
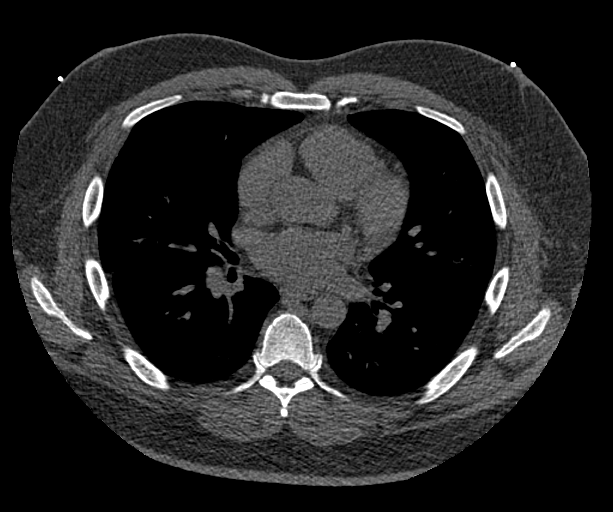
[im 66/80  vessel]
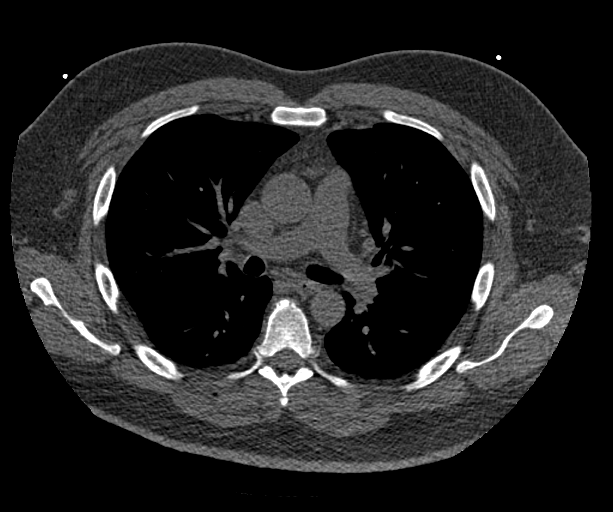
[im 66/80  lung]
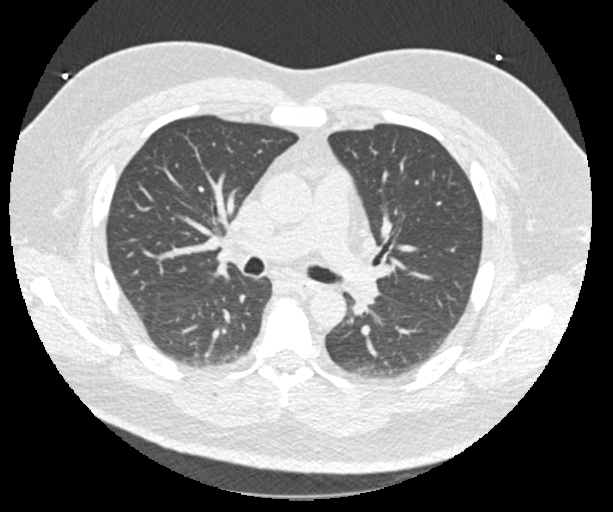

[Series 9: calcium scoring 2.00 br60 bestdiast 70% ax fov · axial · 0.67mm/px · z∈[+1490,+1594]mm · 5 of 80 slices shown]
[im 14/80  vessel]
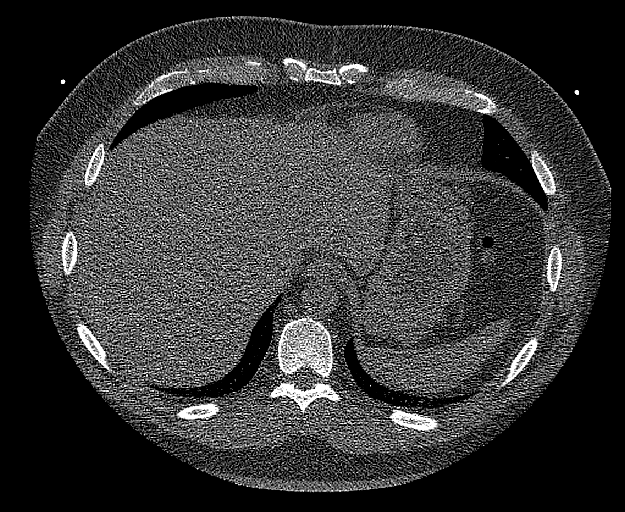
[im 27/80  vessel]
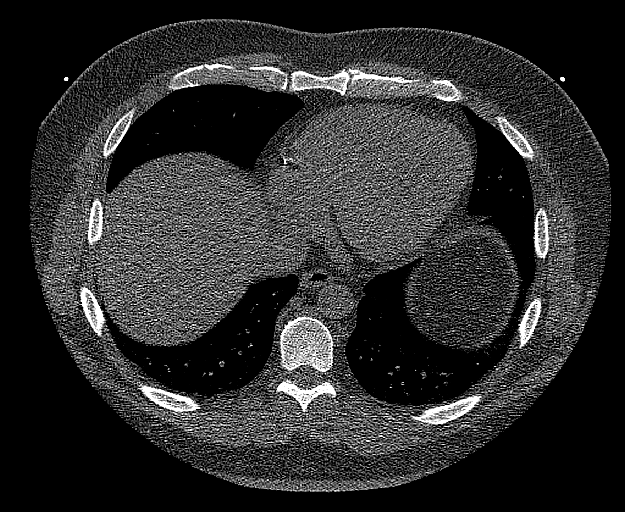
[im 40/80  vessel]
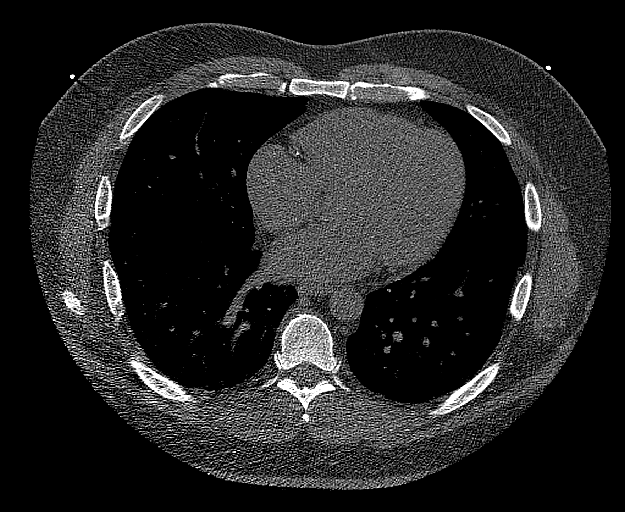
[im 53/80  vessel]
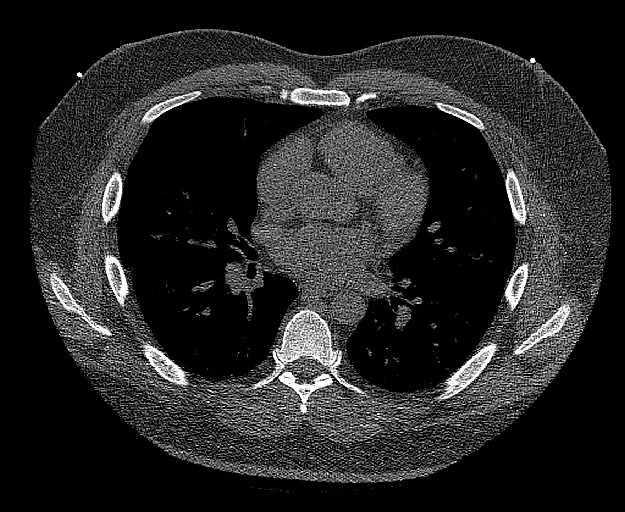
[im 66/80  vessel]
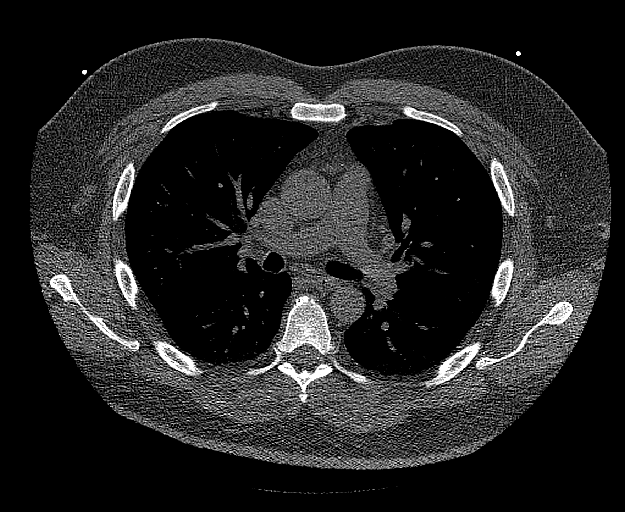

[14 of 20 positions shown; findings below may reference images not displayed]

FINDINGS: Technical quality: Good.

CORONARY CALCIUM

Total Agatston Score: 357 with calcifications scattered throughout
the right coronary artery

[HOSPITAL] percentile:  98

OTHER FINDINGS:

Cardiovascular: Heart is normal size. Visualized aorta is normal
caliber.

Mediastinum/Nodes: No adenopathy in the lower mediastinum or hila.

Lungs/Pleura: Visualized lower lungs clear.  No effusions.

Upper Abdomen: Imaging into the upper abdomen shows no acute
findings.

Musculoskeletal: Chest wall soft tissues are unremarkable. No acute
bony abnormality.
IMPRESSION: The observed calcium score of 357 is at the percentile 98 for
subjects of the same age, gender and race/ethnicity who are free of
clinical cardiovascular disease and treated diabetes.

No acute or significant extracardiac abnormality

## 2019-01-17 DIAGNOSIS — I10 Essential (primary) hypertension: Secondary | ICD-10-CM | POA: Diagnosis not present

## 2019-03-14 ENCOUNTER — Telehealth (INDEPENDENT_AMBULATORY_CARE_PROVIDER_SITE_OTHER): Payer: 59 | Admitting: Cardiology

## 2019-03-14 ENCOUNTER — Other Ambulatory Visit: Payer: Self-pay

## 2019-03-14 ENCOUNTER — Encounter: Payer: Self-pay | Admitting: Cardiology

## 2019-03-14 VITALS — BP 158/98 | HR 80 | Ht 71.0 in | Wt 265.0 lb

## 2019-03-14 DIAGNOSIS — I10 Essential (primary) hypertension: Secondary | ICD-10-CM | POA: Diagnosis not present

## 2019-03-14 DIAGNOSIS — I2584 Coronary atherosclerosis due to calcified coronary lesion: Secondary | ICD-10-CM

## 2019-03-14 DIAGNOSIS — Z79899 Other long term (current) drug therapy: Secondary | ICD-10-CM

## 2019-03-14 DIAGNOSIS — I2510 Atherosclerotic heart disease of native coronary artery without angina pectoris: Secondary | ICD-10-CM

## 2019-03-14 MED ORDER — SPIRONOLACTONE 25 MG PO TABS: 12.5000 mg | ORAL_TABLET | Freq: Every day | ORAL | 3 refills | Status: DC

## 2019-03-14 NOTE — Progress Notes (Signed)
Virtual Visit via Video Note   This visit type was conducted due to national recommendations for restrictions regarding the COVID-19 Pandemic (e.g. social distancing) in an effort to limit this patient's exposure and mitigate transmission in our community.  Due to his co-morbid illnesses, this patient is at least at moderate risk for complications without adequate follow up.  This format is felt to be most appropriate for this patient at this time.  All issues noted in this document were discussed and addressed.  A limited physical exam was performed with this format.  Please refer to the patient's chart for his consent to telehealth for [facility].   Date:  03/14/2019   ID:  Terry Holden, DOB [birth year], MRN [id number]  Patient Location: Home Provider Location: Home  PCP:  Patient, No Pcp Per  Cardiologist:  No primary care provider on file. Skains new Electrophysiologist:  None   Evaluation Performed:  Consultation - Terry Holden was referred by Dr. Tenny Craw for the evaluation of hypertension.  Chief Complaint: Evaluation of hypertension, difficult to control at the request of Dr. Tenny Craw  History of Present Illness:    Terry Holden is a 53 y.o. male with difficulties with hypertension currently on HCTZ 12.5 mg a day, Bystolic 20 mg a day, Azilsartan 40 mg a day, amlodipine 10 mg a day.  Last year 320 pounds, started diet and in 11/19 thought lightheaded was because of 70 pound weight loss. Dr. Tenny Craw took off of one of the meds. Was high again at yearly PE. Felt great. [employer] dept physical, Dr. Alto Denver (190/110), on light duty since then.   158/98 past 2 months.   Father CABG, CVA Mother HTN  Hemoglobin 16.6, LDL 216 with total cholesterol 343, HDL 53, creatinine 0.92 potassium 4.2 sodium 141  Had coronary calcium score performed on 04/09/2018-357 which was 90 percentile.  Aorta was normal in caliber.  No other findings.  The patient does not have symptoms concerning for COVID-19 infection  (fever, chills, cough, or new shortness of breath).    Past Medical History:  Diagnosis Date  . Acute medial meniscus tear of left knee 02/03/2015  . Acute meniscal tear of left knee   . At risk for sleep apnea    STOP-BANG= 4      SENT TO PCP 01-28-2015  . Hyperlipidemia   . Hypertension   . OA (osteoarthritis) of knee    left   Past Surgical History:  Procedure Laterality Date  . KNEE ARTHROSCOPY WITH MEDIAL MENISECTOMY Left 02/03/2015   Procedure: LEFT KNEE ARTHROSCOPY WITH PARTIAL MEDIAL MENISECTOMY AND DEBRIDEMENT/POSSIBLE CHONDROPLASTY;  Surgeon: Eugenia Mcalpine, MD;  Location: [facility];  Service: Orthopedics;  Laterality: Left;     Current Meds  Medication Sig  . amLODipine (NORVASC) 10 MG tablet Take 10 mg by mouth daily.  . Azilsartan-Chlorthalidone 40-25 MG TABS Take 1 tablet by mouth every morning.   . hydrochlorothiazide (HYDRODIURIL) 25 MG tablet Take 25 mg by mouth daily.  . nebivolol (BYSTOLIC) 10 MG tablet Take 10 mg by mouth every morning.   . rosuvastatin (CRESTOR) 10 MG tablet Take 10 mg by mouth daily.     Allergies:   Bee venom and Amoxicillin   Social History   Tobacco Use  . Smoking status: Never Smoker  . Smokeless tobacco: Never Used  Substance Use Topics  . Alcohol use: Yes    Alcohol/week: 2.0 standard drinks    Types: 2 Glasses of wine per week  . Drug  use: No     Family Hx: The patient's family history is not on file.  ROS:   Please see the history of present illness.    Denies any fevers chills nausea vomiting syncope bleeding orthopnea PND. All other systems reviewed and are negative.   Prior CV studies:   The following studies were reviewed today:  Coronary calcium score, EKG  Labs/Other Tests and Data Reviewed:    EKG:  An ECG dated 02/03/15 was personally reviewed today and demonstrated:  Sinus rhythm no other abnormalities  Recent Labs: No results found for requested labs within last 8760 hours.   Recent  Lipid Panel No results found for: CHOL, TRIG, HDL, CHOLHDL, LDLCALC, LDLDIRECT  Wt Readings from Last 3 Encounters:  03/14/19 265 lb (120.2 kg)  10/28/15 300 lb (136.1 kg)  02/03/15 290 lb (131.5 kg)     Objective:    Vital Signs:  BP (!) 158/98 (BP Location: Left Arm, Patient Position: Sitting, Cuff Size: Normal)   Pulse 80   Ht 5\' 11"  (1.803 m)   Wt 265 lb (120.2 kg)   BMI 36.96 kg/m    VITAL SIGNS:  reviewed GEN:  no acute distress EYES:  sclerae anicteric, EOMI - Extraocular Movements Intact RESPIRATORY:  normal respiratory effort, symmetric expansion SKIN:  no rash, lesions or ulcers. MUSCULOSKELETAL:  no obvious deformities. NEURO:  alert and oriented x 3, no obvious focal deficit PSYCH:  normal affect  ASSESSMENT & PLAN:    Difficult to control hypertension -Multi-drug plan noted.  Potassium and sodium normal. -I will add spironolactone 12.5 mg once a day.  Check basic metabolic profile in 1 week.  If blood pressure still elevated, we will increase the spironolactone to 25 mg once a day. -I do realize he is on approximately 50 mg of thiazide type diuretic.  Lets continue that for now. -Please set him up with the pharmacy hypertension clinic to continue to monitor this closely. - If we are unsuccessful at controlling the blood pressure with the addition of spironolactone, we will consider ancillary testing such as renal artery ultrasound, urine and plasma hormone testing. - Expressed the need for continued use of other medications multidrug regimen at this time.  Hopefully his weight loss continues, his blood pressure regimen will drop.  Reduction in blood pressure will help reduce heart attack risk, heart failure risk, renal failure risk  Morbid obesity - Continue to encourage weight loss.  BMI 35 or greater with comorbidities.  Excellent job with 70 pound weight loss.  Hopefully his weight continues to decrease, his blood pressure will follow.  Coronary calcification  - Excellent use of statin.  Continue with optimizing blood pressure control.  Marked hyperlipidemia - LDL above 200.  Continue statin use.  If unsuccessful, could consider additional use of PCSK9 inhibitor for instance.  COVID-19 Education: The signs and symptoms of COVID-19 were discussed with the patient and how to seek care for testing (follow up with PCP or arrange E-visit).  The importance of social distancing was discussed today.  Time:   Today, I have spent 20 minutes with the patient with telehealth technology discussing the above problems.     Medication Adjustments/Labs and Tests Ordered: Current medicines are reviewed at length with the patient today.  Concerns regarding medicines are outlined above.   Tests Ordered: Orders Placed This Encounter  Procedures  . Basic metabolic panel    Medication Changes: Meds ordered this encounter  Medications  . spironolactone (ALDACTONE) 25 MG tablet  Sig: Take 0.5 tablets (12.5 mg total) by mouth daily.    Dispense:  45 tablet    Refill:  3    Disposition:  Follow up in 2 week(s)  Signed, Donato SchultzMark Skains, MD  03/14/2019 9:43 AM    [facility]

## 2019-03-14 NOTE — Patient Instructions (Signed)
Medication Instructions:  Start Spironolactone 25 mg - 1/2 tablet by mouth daily.  Continue all other medications as listed.  If you need a refill on your cardiac medications before your next appointment, please call your pharmacy.   Lab work: Please have blood work drawn as scheduled Thursday 03/21/2019. (BMP)  You may eat and drink as normal this day.  The lab is located at [address], [place], [place].  If you have labs (blood work) drawn today and your tests are completely normal, you will receive your results only by: Marland Kitchen MyChart Message (if you have MyChart) OR . A paper copy in the mail If you have any lab test that is abnormal or we need to change your treatment, we will call you to review the results.  Follow-Up: You will be contacted to be scheduled in 2 weeks for follow up with the Hypertension Clinic (in our office).  Thank you for choosing  [facility]!!

## 2019-03-21 ENCOUNTER — Other Ambulatory Visit: Payer: Self-pay

## 2019-03-21 ENCOUNTER — Other Ambulatory Visit: Payer: 59 | Admitting: *Deleted

## 2019-03-21 DIAGNOSIS — Z79899 Other long term (current) drug therapy: Secondary | ICD-10-CM | POA: Diagnosis not present

## 2019-03-21 DIAGNOSIS — I10 Essential (primary) hypertension: Secondary | ICD-10-CM | POA: Diagnosis not present

## 2019-03-21 LAB — BASIC METABOLIC PANEL
BUN/Creatinine Ratio: 17 (ref 9–20)
BUN: 15 mg/dL (ref 6–24)
CO2: 26 mmol/L (ref 20–29)
Calcium: 9.9 mg/dL (ref 8.7–10.2)
Chloride: 101 mmol/L (ref 96–106)
Creatinine, Ser: 0.87 mg/dL (ref 0.76–1.27)
GFR calc Af Amer: 116 mL/min/{1.73_m2} (ref >59)
GFR calc non Af Amer: 101 mL/min/{1.73_m2} (ref >59)
Glucose: 115 mg/dL — ABNORMAL HIGH (ref 65–99)
Potassium: 4.2 mmol/L (ref 3.5–5.2)
Sodium: 139 mmol/L (ref 134–144)

## 2019-03-28 ENCOUNTER — Telehealth: Payer: 59 | Admitting: Cardiology

## 2019-04-01 ENCOUNTER — Telehealth: Payer: Self-pay

## 2019-04-01 NOTE — Telephone Encounter (Signed)
YOUR CARDIOLOGY TEAM HAS ARRANGED FOR AN E-VISIT FOR YOUR APPOINTMENT - PLEASE REVIEW IMPORTANT INFORMATION BELOW SEVERAL DAYS PRIOR TO YOUR APPOINTMENT  Due to the recent COVID-19 pandemic, we are transitioning in-person office visits to tele-medicine visits in an effort to decrease unnecessary exposure to our patients, their families, and staff. These visits are billed to your insurance just like a normal visit is. We also encourage you to sign up for MyChart if you have not already done so. You will need a smartphone if possible. For patients that do not have this, we can still complete the visit using a regular telephone but do prefer a smartphone to enable video when possible. You may have a family member that lives with you that can help. If possible, we also ask that you have a blood pressure cuff and scale at home to measure your blood pressure, heart rate and weight prior to your scheduled appointment. Patients with clinical needs that need an in-person evaluation and testing will still be able to come to the office if absolutely necessary. If you have any questions, feel free to call our office.     YOUR PROVIDER WILL BE USING THE FOLLOWING PLATFORM TO COMPLETE YOUR VISIT: [email or web address]  . IF USING MYCHART - How to Download the MyChart App to Your SmartPhone   - If Apple, go to App Store and type in MyChart in the search bar and download the app. If Android, ask patient to go to Google Play Store and type in MyChart in the search bar and download the app. The app is free but as with any other app downloads, your phone may require you to verify saved payment information or Apple/Android password.  - You will need to then log into the app with your MyChart username and password, and select [facility] as your healthcare provider to link the account.  - When it is time for your visit, go to the MyChart app, find appointments, and click Begin Video Visit. Be sure to Select Allow for your device to  access the Microphone and Camera for your visit. You will then be connected, and your provider will be with you shortly.  **If you have any issues connecting or need assistance, please contact MyChart service desk [phone] ([phone])**  **If using a computer, in order to ensure the best quality for your visit, you will need to use either of the following Internet Browsers: Google Chrome or Microsoft Edge**  . IF USING DOXIMITY or [email or web address].ME - The staff will give you instructions on receiving your link to join the meeting the day of your visit.      2-3 DAYS BEFORE YOUR APPOINTMENT  You will receive a telephone call from one of our [facility] team members - your caller ID may say "Unknown caller." If this is a video visit, we will walk you through how to get the video launched on your phone. We will remind you check your blood pressure, heart rate and weight prior to your scheduled appointment. If you have an Apple Watch or Kardia, please upload any pertinent ECG strips the day before or morning of your appointment to MyChart. Our staff will also make sure you have reviewed the consent and agree to move forward with your scheduled tele-health visit.     THE DAY OF YOUR APPOINTMENT  Approximately 15 minutes prior to your scheduled appointment, you will receive a telephone call from one of [facility] team - your caller ID may say "Unknown caller."    Our staff will confirm medications, vital signs for the day and any symptoms you may be experiencing. Please have this information available prior to the time of visit start. It may also be helpful for you to have a pad of paper and pen handy for any instructions given during your visit. They will also walk you through joining the smartphone meeting if this is a video visit.    CONSENT FOR TELE-HEALTH VISIT - PLEASE REVIEW  I hereby voluntarily request, consent and authorize [facility] and its employed or contracted physicians, physician  assistants, nurse practitioners or other licensed health care professionals (the Practitioner), to provide me with telemedicine health care services (the "Services") as deemed necessary by the treating Practitioner. I acknowledge and consent to receive the Services by the Practitioner via telemedicine. I understand that the telemedicine visit will involve communicating with the Practitioner through live audiovisual communication technology and the disclosure of certain medical information by electronic transmission. I acknowledge that I have been given the opportunity to request an in-person assessment or other available alternative prior to the telemedicine visit and am voluntarily participating in the telemedicine visit.  I understand that I have the right to withhold or withdraw my consent to the use of telemedicine in the course of my care at any time, without affecting my right to future care or treatment, and that the Practitioner or I may terminate the telemedicine visit at any time. I understand that I have the right to inspect all information obtained and/or recorded in the course of the telemedicine visit and may receive copies of available information for a reasonable fee.  I understand that some of the potential risks of receiving the Services via telemedicine include:  . Delay or interruption in medical evaluation due to technological equipment failure or disruption; . Information transmitted may not be sufficient (e.g. poor resolution of images) to allow for appropriate medical decision making by the Practitioner; and/or  . In rare instances, security protocols could fail, causing a breach of personal health information.  Furthermore, I acknowledge that it is my responsibility to provide information about my medical history, conditions and care that is complete and accurate to the best of my ability. I acknowledge that Practitioner's advice, recommendations, and/or decision may be based on  factors not within their control, such as incomplete or inaccurate data provided by me or distortions of diagnostic images or specimens that may result from electronic transmissions. I understand that the practice of medicine is not an exact science and that Practitioner makes no warranties or guarantees regarding treatment outcomes. I acknowledge that I will receive a copy of this consent concurrently upon execution via email to the email address I last provided but may also request a printed copy by calling the office of [facility].    I understand that my insurance will be billed for this visit.   I have read or had this consent read to me. . I understand the contents of this consent, which adequately explains the benefits and risks of the Services being provided via telemedicine.  . I have been provided ample opportunity to ask questions regarding this consent and the Services and have had my questions answered to my satisfaction. . I give my informed consent for the services to be provided through the use of telemedicine in my medical care  By participating in this telemedicine visit I agree to the above.  

## 2019-04-02 ENCOUNTER — Encounter: Payer: Self-pay | Admitting: Cardiology

## 2019-04-02 ENCOUNTER — Telehealth (INDEPENDENT_AMBULATORY_CARE_PROVIDER_SITE_OTHER): Payer: 59 | Admitting: Cardiology

## 2019-04-02 ENCOUNTER — Other Ambulatory Visit: Payer: Self-pay

## 2019-04-02 VITALS — BP 148/92 | HR 68 | Ht 71.0 in | Wt 267.0 lb

## 2019-04-02 DIAGNOSIS — I10 Essential (primary) hypertension: Secondary | ICD-10-CM

## 2019-04-02 DIAGNOSIS — I2584 Coronary atherosclerosis due to calcified coronary lesion: Secondary | ICD-10-CM | POA: Diagnosis not present

## 2019-04-02 DIAGNOSIS — I2510 Atherosclerotic heart disease of native coronary artery without angina pectoris: Secondary | ICD-10-CM

## 2019-04-02 DIAGNOSIS — Z79899 Other long term (current) drug therapy: Secondary | ICD-10-CM

## 2019-04-02 DIAGNOSIS — E782 Mixed hyperlipidemia: Secondary | ICD-10-CM

## 2019-04-02 NOTE — Patient Instructions (Addendum)
Medication Instructions:  The current medical regimen is effective;  continue present plan and medications.  If you need a refill on your cardiac medications before your next appointment, please call your pharmacy.   Lab work: Please have fasting blood work in 1 month as scheduled. (Lipid, CMP)  If you need to reschedule this appointment, please contact the office.  If you have labs (blood work) drawn today and your tests are completely normal, you will receive your results only by: Marland Kitchen MyChart Message (if you have MyChart) OR . A paper copy in the mail If you have any lab test that is abnormal or we need to change your treatment, we will call you to review the results.  Follow-Up: At [facility], you and your health needs are our priority.  As part of our continuing mission to provide you with exceptional heart care, we have created designated Provider Care Teams.  These Care Teams include your primary Cardiologist (physician) and Advanced Practice Providers (APPs -  Physician Assistants and Nurse Practitioners) who all work together to provide you with the care you need, when you need it. You will need a follow up appointment with Dr Anne Fu in 2 months as scheduled.  If you need to reschedule this appointment please contact the office. You may see Donato Schultz, MD or one of the following Advanced Practice Providers on your designated Care Team:   Norma Fredrickson, NP Nada Boozer, NP . Georgie Chard, NP   Thank you for choosing [facility]!!

## 2019-04-02 NOTE — Progress Notes (Signed)
Virtual Visit via Video Note   This visit type was conducted due to national recommendations for restrictions regarding the COVID-19 Pandemic (e.g. social distancing) in an effort to limit this patient's exposure and mitigate transmission in our community.  Due to his co-morbid illnesses, this patient is at least at moderate risk for complications without adequate follow up.  This format is felt to be most appropriate for this patient at this time.  All issues noted in this document were discussed and addressed.  A limited physical exam was performed with this format.  Please refer to the patient's chart for his consent to telehealth for [facility].   Date:  04/02/2019   ID:  Terry PiquePeter Holden, DOB [birth year], MRN [id number]  Patient Location: Home Provider Location: Home  PCP:  Daisy Florooss, Charles Alan, MD  Cardiologist:  Donato SchultzMark Skains, MD  Electrophysiologist:  None   Evaluation Performed:  Follow-Up Visit  Chief Complaint:  HTN  History of Present Illness:    Terry Piqueeter Peschke is a 53 y.o. male with difficulties with hypertension currently on HCTZ 12.5 mg a day, Bystolic 20 mg a day, Azilsartan 40 mg a day, amlodipine 10 mg a day.  Last year 320 pounds, started diet and in 11/19 thought lightheaded was because of 70 pound weight loss. Dr. Tenny Crawoss took off of one of the meds. Was high again at yearly PE. Felt great. 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   158/98 past 2 months.   Father CABG, CVA Mother HTN  Hemoglobin 16.6, LDL 216 with total cholesterol 343, HDL 53, creatinine 0.92 potassium 4.2 sodium 141  Had coronary calcium score performed on 04/09/2018-357 which was 90 percentile.  Aorta was normal in caliber.  No other findings.  04/02/2019-since her last visit, his spironolactone was increased from 12.5 up to 25 mg.  His blood pressures have responded and improved to about 140/90 this morning for instance.  Continue to discuss exercise fluid management salt  restriction.  He denies any chest pain fevers chills nausea vomiting syncope bleeding  The patient does not have symptoms concerning for COVID-19 infection (fever, chills, cough, or new shortness of breath).    Past Medical History:  Diagnosis Date  . Acute medial meniscus tear of left knee 02/03/2015  . Acute meniscal tear of left knee   . At risk for sleep apnea    STOP-BANG= 4      SENT TO PCP 01-28-2015  . Hyperlipidemia   . Hypertension   . OA (osteoarthritis) of knee    left   Past Surgical History:  Procedure Laterality Date  . KNEE ARTHROSCOPY WITH MEDIAL MENISECTOMY Left 02/03/2015   Procedure: LEFT KNEE ARTHROSCOPY WITH PARTIAL MEDIAL MENISECTOMY AND DEBRIDEMENT/POSSIBLE CHONDROPLASTY;  Surgeon: Eugenia Mcalpineobert Collins, MD;  [facility];  Service: Orthopedics;  Laterality: Left;     Current Meds  Medication Sig  . amLODipine (NORVASC) 10 MG tablet Take 10 mg by mouth daily.  . Azilsartan-Chlorthalidone 40-25 MG TABS Take 1 tablet by mouth every morning.   Marland Kitchen. BYSTOLIC 20 MG TABS Take 20 mg by mouth daily.  . hydrochlorothiazide (HYDRODIURIL) 25 MG tablet Take 25 mg by mouth daily.  Marland Kitchen. ibuprofen (ADVIL,MOTRIN) 200 MG tablet Take 200 mg by mouth every 6 (six) hours as needed.  . rosuvastatin (CRESTOR) 10 MG tablet Take 10 mg by mouth daily.  Marland Kitchen. spironolactone (ALDACTONE) 25 MG tablet Take 25 mg by mouth daily.     Allergies:  Bee venom and Amoxicillin   Social History   Tobacco Use  . Smoking status: Never Smoker  . Smokeless tobacco: Never Used  Substance Use Topics  . Alcohol use: Yes    Alcohol/week: 2.0 standard drinks    Types: 2 Glasses of wine per week  . Drug use: No     Family Hx: The patient's family history is not on file. No early hx CAD  ROS:   Please see the history of present illness.     All other systems reviewed and are negative.   Prior CV studies:   The following studies were reviewed today:  Coronary calcium score  reviewed  Labs/Other Tests and Data Reviewed:    EKG: 02/03/2015-sinus rhythm no other abnormalities  Recent Labs: 03/21/2019: BUN 15; Creatinine, Ser 0.87; Potassium 4.2; Sodium 139   Recent Lipid Panel No results found for: CHOL, TRIG, HDL, CHOLHDL, LDLCALC, LDLDIRECT  Wt Readings from Last 3 Encounters:  04/02/19 267 lb (121.1 kg)  03/14/19 265 lb (120.2 kg)  10/28/15 300 lb (136.1 kg)     Objective:    Vital Signs:  BP (!) 148/92   Pulse 68   Ht  (1.803 m)   Wt 267 lb (121.1 kg)   BMI 37.24 kg/m    VITAL SIGNS:  reviewed GEN:  no acute distress EYES:  sclerae anicteric, EOMI - Extraocular Movements Intact RESPIRATORY:  normal respiratory effort, symmetric expansion SKIN:  no rash, lesions or ulcers. MUSCULOSKELETAL:  no obvious deformities. NEURO:  alert and oriented x 3, no obvious focal deficit PSYCH:  normal affect  ASSESSMENT & PLAN:    Difficult to control hypertension -Multi-drug plan noted.  Potassium and sodium normal. -I do realize he is on approximately 50 mg of thiazide type diuretic.  Lets continue that for now.  - now 140/90. Now on 25 aldactone. Doing better.  -Please set him up with the pharmacy hypertension clinic to continue to monitor this closely. - Expressed the need for continued use of other medications multidrug regimen at this time.  Hopefully his weight loss continues, his blood pressure regimen will drop.  Reduction in blood pressure will help reduce heart attack risk, heart failure risk, renal failure risk. Exercise.  We will check metabolic profile again in a month.  If his blood pressure stays in this general range, I think would be reasonable for him to slowly return back to full duty.  Morbid obesity - Continue to encourage weight loss.  BMI 35 or greater with comorbidities.  Excellent job with 70 pound weight loss.  Hopefully his weight continues to decrease, his blood pressure will follow.  Continue to encourage continue to  encourage  Coronary calcification - Total Agatston Score: 357 with calcifications scattered throughout the right coronary artery. Excellent use of statin.  Continue with optimizing blood pressure control.  Taking Crestor 10 mg a day.  We will check a lipid panel in 1 month  Marked hyperlipidemia - LDL above 200.  Continue statin use.  If unsuccessful, could consider additional use of PCSK9 inhibitor for instance.  Taking Crestor once a day.  Checking lipid panel in 1 month   COVID-19 Education: The signs and symptoms of COVID-19 were discussed with the patient and how to seek care for testing (follow up with PCP or arrange E-visit).  The importance of social distancing was discussed today.  Time:   Today, I have spent 11 minutes with the patient with telehealth technology discussing the above problems.  Medication Adjustments/Labs and Tests Ordered: Current medicines are reviewed at length with the patient today.  Concerns regarding medicines are outlined above.   Tests Ordered: Orders Placed This Encounter  Procedures  . Comprehensive metabolic panel  . Lipid panel    Medication Changes: No orders of the defined types were placed in this encounter.   Disposition:  Follow up in 2 month(s)  Signed, Donato Schultz, MD  04/02/2019 9:19 AM    [facility]

## 2019-04-08 ENCOUNTER — Other Ambulatory Visit: Payer: Self-pay | Admitting: Cardiology

## 2019-04-08 MED ORDER — SPIRONOLACTONE 25 MG PO TABS: 25.0000 mg | ORAL_TABLET | Freq: Every day | ORAL | 3 refills | Status: DC

## 2019-05-02 ENCOUNTER — Other Ambulatory Visit: Payer: Self-pay

## 2019-05-02 ENCOUNTER — Other Ambulatory Visit: Payer: 59 | Admitting: *Deleted

## 2019-05-02 DIAGNOSIS — Z79899 Other long term (current) drug therapy: Secondary | ICD-10-CM

## 2019-05-02 DIAGNOSIS — I2510 Atherosclerotic heart disease of native coronary artery without angina pectoris: Secondary | ICD-10-CM

## 2019-05-02 DIAGNOSIS — I10 Essential (primary) hypertension: Secondary | ICD-10-CM

## 2019-05-02 DIAGNOSIS — I2584 Coronary atherosclerosis due to calcified coronary lesion: Secondary | ICD-10-CM

## 2019-05-02 LAB — COMPREHENSIVE METABOLIC PANEL
ALT: 47 IU/L — ABNORMAL HIGH (ref 0–44)
AST: 146 IU/L — ABNORMAL HIGH (ref 0–40)
Albumin/Globulin Ratio: 1.5 (ref 1.2–2.2)
Albumin: 4.9 g/dL (ref 4.0–5.0)
Alkaline Phosphatase: 66 IU/L (ref 39–117)
BUN/Creatinine Ratio: 19 (ref 9–20)
BUN: 29 mg/dL — ABNORMAL HIGH (ref 6–24)
Bilirubin Total: 0.5 mg/dL (ref 0.0–1.2)
CO2: 24 mmol/L (ref 20–29)
Calcium: 10.2 mg/dL (ref 8.7–10.2)
Chloride: 98 mmol/L (ref 96–106)
Creatinine, Ser: 1.55 mg/dL — ABNORMAL HIGH (ref 0.76–1.27)
GFR calc Af Amer: 59 mL/min/{1.73_m2} — ABNORMAL LOW (ref >59)
GFR calc non Af Amer: 51 mL/min/{1.73_m2} — ABNORMAL LOW (ref >59)
Globulin, Total: 3.2 g/dL (ref 1.5–4.5)
Glucose: 104 mg/dL — ABNORMAL HIGH (ref 65–99)
Potassium: 4.3 mmol/L (ref 3.5–5.2)
Sodium: 139 mmol/L (ref 134–144)
Total Protein: 8.1 g/dL (ref 6.0–8.5)

## 2019-05-02 LAB — LIPID PANEL
Chol/HDL Ratio: 5.3 ratio — ABNORMAL HIGH (ref 0.0–5.0)
Cholesterol, Total: 246 mg/dL — ABNORMAL HIGH (ref 100–199)
HDL: 46 mg/dL (ref >39)
LDL Calculated: 150 mg/dL — ABNORMAL HIGH (ref 0–99)
Triglycerides: 252 mg/dL — ABNORMAL HIGH (ref 0–149)
VLDL Cholesterol Cal: 50 mg/dL — ABNORMAL HIGH (ref 5–40)

## 2019-05-13 ENCOUNTER — Other Ambulatory Visit: Payer: Self-pay | Admitting: *Deleted

## 2019-05-13 DIAGNOSIS — Z79899 Other long term (current) drug therapy: Secondary | ICD-10-CM

## 2019-05-13 DIAGNOSIS — E785 Hyperlipidemia, unspecified: Secondary | ICD-10-CM

## 2019-05-22 NOTE — Telephone Encounter (Signed)
Situation sounds a little bit more complex. Lets go ahead and set him up with a virtual visit.  Since I will be out please see if APP can help.   Candee Furbish, MD

## 2019-06-18 ENCOUNTER — Encounter: Payer: Self-pay | Admitting: Cardiology

## 2019-06-18 ENCOUNTER — Other Ambulatory Visit: Payer: Self-pay

## 2019-06-18 ENCOUNTER — Encounter: Payer: Self-pay | Admitting: *Deleted

## 2019-06-18 ENCOUNTER — Telehealth (INDEPENDENT_AMBULATORY_CARE_PROVIDER_SITE_OTHER): Payer: 59 | Admitting: Cardiology

## 2019-06-18 VITALS — BP 118/70 | HR 70 | Ht 71.0 in | Wt 270.0 lb

## 2019-06-18 DIAGNOSIS — Z79899 Other long term (current) drug therapy: Secondary | ICD-10-CM | POA: Diagnosis not present

## 2019-06-18 DIAGNOSIS — I10 Essential (primary) hypertension: Secondary | ICD-10-CM | POA: Diagnosis not present

## 2019-06-18 NOTE — Patient Instructions (Signed)
Medication Instructions:  The current medical regimen is effective;  continue present plan and medications.  If you need a refill on your cardiac medications before your next appointment, please call your pharmacy.   Lab work: Please have blood work as scheduled (Lipid, ALT, BMP) If you have labs (blood work) drawn today and your tests are completely normal, you will receive your results only by: Marland Kitchen MyChart Message (if you have MyChart) OR . A paper copy in the mail If you have any lab test that is abnormal or we need to change your treatment, we will call you to review the results.  Follow-Up: Follow up with Dr Marlou Porch as needed.  Thank you for choosing [facility]!!

## 2019-06-18 NOTE — Progress Notes (Signed)
Virtual Visit via Video Note   This visit type was conducted due to national recommendations for restrictions regarding the COVID-19 Pandemic (e.g. social distancing) in an effort to limit this patient's exposure and mitigate transmission in our community.  Due to his co-morbid illnesses, this patient is at least at moderate risk for complications without adequate follow up.  This format is felt to be most appropriate for this patient at this time.  All issues noted in this document were discussed and addressed.  A limited physical exam was performed with this format.  Please refer to the patient's chart for his consent to telehealth for [facility].   Date:  06/18/2019   ID:  Terry Holden, DOB August 28, 1966, MRN [id number]  Patient Location: Home Provider Location: Home  PCP:  Lawerance Cruel, MD  Cardiologist:  Candee Furbish, MD  Electrophysiologist:  None   Evaluation Performed: Follow-up visit  Chief Complaint:  HTN follow up.   History of Present Illness:    Terry Holden is a 53 y.o. male with with difficulties with hypertension. Has been on HCTZ 38.1 mg a day, Bystolic 20 mg a day, OFBPZWCHEN27 mg a day, amlodipine 10 mg a day. Added spironolactone 25 mg a day.  His blood pressure was exceedingly low at 90/50 and I asked him to discontinue the spironolactone.  He sent me a message on 05/16/2019 where his blood pressure was 110/70 off of the medication.  Doing quite well.  He was concerned that coming off the spironolactone because his blood pressure to spike but he is feeling better.  He then sent another message and and I suggested that he maintain to stop the spironolactone for now and see how his blood pressure reacts.  Keep up the exercise.  He was worried that a rash was secondary to this medication. Now thinks   Last year 320 pounds, started diet and in 11/19 thought lightheaded was because of 70 pound weight loss. Dr. Harrington Challenger took off of one of the meds. Was high again at yearly  PE. Felt great. 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   Father CABG, CVA Mother HTN  Hemoglobin 16.6, LDL 216 with total cholesterol 343, HDL 53, creatinine 0.92 potassium 4.2 sodium 141  His creatinine did increase to 1.55 and BUN to 29 from 0.87 and 15 back in May 02, 2019  Had coronary calcium score performed on 04/09/2018-357 which was 90 percentile. Aorta was normal in caliber. No other findings.  The patient does not have symptoms concerning for COVID-19 infection (fever, chills, cough, or new shortness of breath).    Past Medical History:  Diagnosis Date  . Acute medial meniscus tear of left knee 02/03/2015  . Acute meniscal tear of left knee   . At risk for sleep apnea    STOP-BANG= 4      SENT TO PCP 01-28-2015  . Hyperlipidemia   . Hypertension   . OA (osteoarthritis) of knee    left   Past Surgical History:  Procedure Laterality Date  . KNEE ARTHROSCOPY WITH MEDIAL MENISECTOMY Left 02/03/2015   Procedure: LEFT KNEE ARTHROSCOPY WITH PARTIAL MEDIAL MENISECTOMY AND DEBRIDEMENT/POSSIBLE CHONDROPLASTY;  Surgeon: Sydnee Cabal, MD;  Location: [facility];  Service: Orthopedics;  Laterality: Left;     Current Meds  Medication Sig  . amLODipine (NORVASC) 10 MG tablet Take 10 mg by mouth daily.  . Azilsartan-Chlorthalidone 40-25 MG TABS Take 1 tablet by mouth every morning.   Marland Kitchen  BYSTOLIC 20 MG TABS Take 20 mg by mouth daily.  Marland Kitchen. EDARBI 40 MG TABS Take 40 mg by mouth daily.  . hydrochlorothiazide (HYDRODIURIL) 25 MG tablet Take 25 mg by mouth daily.  Marland Kitchen. ibuprofen (ADVIL,MOTRIN) 200 MG tablet Take 200 mg by mouth every 6 (six) hours as needed.  . rosuvastatin (CRESTOR) 10 MG tablet Take 20 mg by mouth daily.     Allergies:   Bee venom and Amoxicillin   Social History   Tobacco Use  . Smoking status: Never Smoker  . Smokeless tobacco: Never Used  Substance Use Topics  . Alcohol use: Yes    Alcohol/week: 2.0 standard drinks     Types: 2 Glasses of wine per week  . Drug use: No     Family Hx: The patient's family history is not on file.  ROS:   Please see the history of present illness.    Denies any fevers chills nausea vomiting syncope All other systems reviewed and are negative.   Prior CV studies:   The following studies were reviewed today:  Calcium score CT noted as above.  Coronary calcium noted.  Labs/Other Tests and Data Reviewed:    EKG:  No ECG reviewed.  Recent Labs: 05/02/2019: ALT 47; BUN 29; Creatinine, Ser 1.55; Potassium 4.3; Sodium 139   Recent Lipid Panel Lab Results  Component Value Date/Time   CHOL 246 (H) 05/02/2019 10:15 AM   TRIG 252 (H) 05/02/2019 10:15 AM   HDL 46 05/02/2019 10:15 AM   CHOLHDL 5.3 (H) 05/02/2019 10:15 AM   LDLCALC 150 (H) 05/02/2019 10:15 AM    Wt Readings from Last 3 Encounters:  06/18/19 270 lb (122.5 kg)  04/02/19 267 lb (121.1 kg)  03/14/19 265 lb (120.2 kg)     Objective:    Vital Signs:  BP 118/70   Pulse 70   Ht 5\' 11"  (1.803 m)   Wt 270 lb (122.5 kg)   BMI 37.66 kg/m    VITAL SIGNS:  reviewed GEN:  no acute distress EYES:  sclerae anicteric, EOMI - Extraocular Movements Intact RESPIRATORY:  normal respiratory effort, symmetric expansion SKIN:  no rash, lesions or ulcers. MUSCULOSKELETAL:  no obvious deformities. NEURO:  alert and oriented x 3, no obvious focal deficit PSYCH:  normal affect  ASSESSMENT & PLAN:    Difficult to control hypertension -Multi-drug plan noted. Potassium and sodium normal. -I do realize he is on approximately 50 mg of thiazide type diuretic. Lets continue that for now.  -I asked him to stop his spironolactone after his blood pressure became very low 90/50. Doing better.   Exercising. - Expressed the need for continued use of other medications multidrug regimen at this time. Hopefully his weight loss continues, his blood pressure regimen will drop. Reduction in blood pressure will help reduce heart  attack risk, heart failure risk, renal failure risk. Exercise.    Morbid obesity - Continue to encourage weight loss. BMI 35 or greater with comorbidities. Excellent job with 70 pound weight loss. Hopefully his weight continues to decrease, his blood pressure will follow.  Continue to encourage continue to encourage  Coronary calcification - Total Agatston Score: 357 with calcifications scattered throughout the right coronary artery. Excellent use of statin. Continue with optimizing blood pressure control.  Taking Crestor 10 mg a day.    LDL 150, triglycerides 252.  We will increase his Crestor to 20 mg once a day. Check BMET in 07/2019 as well.   Marked hyperlipidemia - LDL above  200 at baseline. Continue statin use. If unsuccessful, could consider additional use of PCSK9 inhibitor for instance.    See above for Crestor change   COVID-19 Education: The signs and symptoms of COVID-19 were discussed with the patient and how to seek care for testing (follow up with PCP or arrange E-visit).  The importance of social distancing was discussed today.  Time:   Today, I have spent 20 minutes with the patient with telehealth technology discussing the above problems.     Medication Adjustments/Labs and Tests Ordered: Current medicines are reviewed at length with the patient today.  Concerns regarding medicines are outlined above.   Tests Ordered: Orders Placed This Encounter  Procedures  . Basic metabolic panel    Medication Changes: No orders of the defined types were placed in this encounter.   Follow Up:  Virtual Visit or In Person in 6 month(s)  Signed, Donato SchultzMark Skains, MD  06/18/2019 11:33 AM    [facility]

## 2019-07-16 ENCOUNTER — Other Ambulatory Visit: Payer: Self-pay

## 2019-07-16 ENCOUNTER — Other Ambulatory Visit: Payer: 59

## 2019-07-16 DIAGNOSIS — Z79899 Other long term (current) drug therapy: Secondary | ICD-10-CM

## 2019-07-16 DIAGNOSIS — E785 Hyperlipidemia, unspecified: Secondary | ICD-10-CM

## 2019-07-16 DIAGNOSIS — I10 Essential (primary) hypertension: Secondary | ICD-10-CM

## 2019-07-16 LAB — BASIC METABOLIC PANEL
BUN/Creatinine Ratio: 19 (ref 9–20)
BUN: 18 mg/dL (ref 6–24)
CO2: 23 mmol/L (ref 20–29)
Calcium: 9.7 mg/dL (ref 8.7–10.2)
Chloride: 100 mmol/L (ref 96–106)
Creatinine, Ser: 0.95 mg/dL (ref 0.76–1.27)
GFR calc Af Amer: 107 mL/min/{1.73_m2} (ref >59)
GFR calc non Af Amer: 93 mL/min/{1.73_m2} (ref >59)
Glucose: 110 mg/dL — ABNORMAL HIGH (ref 65–99)
Potassium: 4.2 mmol/L (ref 3.5–5.2)
Sodium: 138 mmol/L (ref 134–144)

## 2019-07-16 LAB — LIPID PANEL
Chol/HDL Ratio: 4.5 ratio (ref 0.0–5.0)
Cholesterol, Total: 214 mg/dL — ABNORMAL HIGH (ref 100–199)
HDL: 48 mg/dL (ref >39)
LDL Chol Calc (NIH): 136 mg/dL — ABNORMAL HIGH (ref 0–99)
Triglycerides: 170 mg/dL — ABNORMAL HIGH (ref 0–149)
VLDL Cholesterol Cal: 30 mg/dL (ref 5–40)

## 2019-07-16 LAB — ALT: ALT: 27 IU/L (ref 0–44)

## 2019-07-17 ENCOUNTER — Telehealth: Payer: Self-pay

## 2019-07-17 DIAGNOSIS — I2510 Atherosclerotic heart disease of native coronary artery without angina pectoris: Secondary | ICD-10-CM

## 2019-07-17 DIAGNOSIS — Z79899 Other long term (current) drug therapy: Secondary | ICD-10-CM

## 2019-07-17 DIAGNOSIS — I2584 Coronary atherosclerosis due to calcified coronary lesion: Secondary | ICD-10-CM

## 2019-07-17 DIAGNOSIS — E785 Hyperlipidemia, unspecified: Secondary | ICD-10-CM

## 2019-07-17 DIAGNOSIS — E782 Mixed hyperlipidemia: Secondary | ICD-10-CM

## 2019-07-17 NOTE — Telephone Encounter (Signed)
Pt advised and agrees to referral to [facility].

## 2019-07-17 NOTE — Telephone Encounter (Signed)
-----   Message from Jerline Pain, MD sent at 07/17/2019  8:28 AM EDT ----- LDL 136 improved but not at goal <70 On Crestor 20mg   Please refer to lipid clinic to discuss PCSK9 I  Kidney function improved.  Potassium stable.   Candee Furbish, MD

## 2019-08-05 ENCOUNTER — Ambulatory Visit: Payer: 59

## 2023-07-11 ENCOUNTER — Other Ambulatory Visit: Payer: Self-pay | Admitting: Internal Medicine

## 2023-07-11 DIAGNOSIS — I10 Essential (primary) hypertension: Secondary | ICD-10-CM

## 2023-07-11 DIAGNOSIS — E261 Secondary hyperaldosteronism: Secondary | ICD-10-CM

## 2023-07-21 ENCOUNTER — Ambulatory Visit
Admission: RE | Admit: 2023-07-21 | Discharge: 2023-07-21 | Disposition: A | Discharge status: Home / Self Care | Payer: 59 | Source: Ambulatory Visit | Attending: Internal Medicine | Admitting: Internal Medicine

## 2023-07-21 DIAGNOSIS — E261 Secondary hyperaldosteronism: Secondary | ICD-10-CM

## 2023-07-21 DIAGNOSIS — I10 Essential (primary) hypertension: Secondary | ICD-10-CM

## 2024-12-05 ENCOUNTER — Other Ambulatory Visit (HOSPITAL_BASED_OUTPATIENT_CLINIC_OR_DEPARTMENT_OTHER): Payer: Self-pay | Admitting: Family Medicine

## 2024-12-05 DIAGNOSIS — Z8249 Family history of ischemic heart disease and other diseases of the circulatory system: Secondary | ICD-10-CM
